# Patient Record
Sex: Female | Born: 1935 | Race: White | Hispanic: No | Smoking: Former smoker
Health system: Southern US, Community
[De-identification: ages and names within clinical notes are randomized; demographics above are authoritative.]

## PROBLEM LIST (undated history)

## (undated) DIAGNOSIS — K644 Residual hemorrhoidal skin tags: Secondary | ICD-10-CM

## (undated) DIAGNOSIS — K219 Gastro-esophageal reflux disease without esophagitis: Secondary | ICD-10-CM

## (undated) DIAGNOSIS — N3010 Interstitial cystitis (chronic) without hematuria: Secondary | ICD-10-CM

## (undated) DIAGNOSIS — Z9189 Other specified personal risk factors, not elsewhere classified: Secondary | ICD-10-CM

## (undated) DIAGNOSIS — I10 Essential (primary) hypertension: Secondary | ICD-10-CM

## (undated) DIAGNOSIS — I4510 Unspecified right bundle-branch block: Secondary | ICD-10-CM

## (undated) DIAGNOSIS — I639 Cerebral infarction, unspecified: Secondary | ICD-10-CM

## (undated) DIAGNOSIS — F411 Generalized anxiety disorder: Secondary | ICD-10-CM

## (undated) DIAGNOSIS — K625 Hemorrhage of anus and rectum: Secondary | ICD-10-CM

## (undated) DIAGNOSIS — E785 Hyperlipidemia, unspecified: Secondary | ICD-10-CM

## (undated) DIAGNOSIS — D509 Iron deficiency anemia, unspecified: Secondary | ICD-10-CM

## (undated) DIAGNOSIS — M810 Age-related osteoporosis without current pathological fracture: Secondary | ICD-10-CM

## (undated) DIAGNOSIS — R079 Chest pain, unspecified: Secondary | ICD-10-CM

## (undated) DIAGNOSIS — I6529 Occlusion and stenosis of unspecified carotid artery: Secondary | ICD-10-CM

## (undated) DIAGNOSIS — K117 Disturbances of salivary secretion: Secondary | ICD-10-CM

## (undated) HISTORY — DX: Disturbances of salivary secretion: K11.7

## (undated) HISTORY — DX: Hyperlipidemia, unspecified: E78.5

## (undated) HISTORY — DX: Cerebral infarction, unspecified: I63.9

## (undated) HISTORY — DX: Unspecified right bundle-branch block: I45.10

## (undated) HISTORY — DX: Hemorrhage of anus and rectum: K62.5

## (undated) HISTORY — DX: Gastro-esophageal reflux disease without esophagitis: K21.9

## (undated) HISTORY — DX: Residual hemorrhoidal skin tags: K64.4

## (undated) HISTORY — DX: Age-related osteoporosis without current pathological fracture: M81.0

## (undated) HISTORY — DX: Interstitial cystitis (chronic) without hematuria: N30.10

## (undated) HISTORY — DX: Other specified personal risk factors, not elsewhere classified: Z91.89

## (undated) HISTORY — DX: Essential (primary) hypertension: I10

## (undated) HISTORY — DX: Chest pain, unspecified: R07.9

## (undated) HISTORY — DX: Iron deficiency anemia, unspecified: D50.9

## (undated) HISTORY — DX: Occlusion and stenosis of unspecified carotid artery: I65.29

## (undated) HISTORY — DX: Generalized anxiety disorder: F41.1

## (undated) HISTORY — PX: ABDOMINAL HYSTERECTOMY: SHX81

---

## 1999-05-06 ENCOUNTER — Encounter: Admission: RE | Admit: 1999-05-06 | Discharge: 1999-05-06 | Payer: Self-pay | Admitting: Family Medicine

## 1999-05-06 ENCOUNTER — Encounter: Payer: Self-pay | Admitting: Family Medicine

## 1999-09-04 ENCOUNTER — Encounter: Payer: Self-pay | Admitting: Specialist

## 1999-09-10 ENCOUNTER — Inpatient Hospital Stay (HOSPITAL_COMMUNITY): Admission: RE | Admit: 1999-09-10 | Discharge: 1999-09-12 | Payer: Self-pay | Admitting: Specialist

## 2000-07-13 ENCOUNTER — Encounter: Admission: RE | Admit: 2000-07-13 | Discharge: 2000-07-13 | Payer: Self-pay | Admitting: Family Medicine

## 2000-07-13 ENCOUNTER — Encounter: Payer: Self-pay | Admitting: Family Medicine

## 2001-08-01 ENCOUNTER — Encounter: Admission: RE | Admit: 2001-08-01 | Discharge: 2001-08-01 | Payer: Self-pay | Admitting: Family Medicine

## 2001-08-01 ENCOUNTER — Encounter: Payer: Self-pay | Admitting: Family Medicine

## 2002-05-01 ENCOUNTER — Encounter: Payer: Self-pay | Admitting: Cardiovascular Disease

## 2002-05-01 ENCOUNTER — Ambulatory Visit (HOSPITAL_COMMUNITY): Admission: RE | Admit: 2002-05-01 | Discharge: 2002-05-01 | Payer: Self-pay | Admitting: Cardiovascular Disease

## 2003-01-10 ENCOUNTER — Ambulatory Visit (HOSPITAL_COMMUNITY): Admission: RE | Admit: 2003-01-10 | Discharge: 2003-01-10 | Payer: Self-pay | Admitting: Orthopedic Surgery

## 2003-02-21 ENCOUNTER — Encounter: Admission: RE | Admit: 2003-02-21 | Discharge: 2003-02-21 | Payer: Self-pay | Admitting: Family Medicine

## 2003-03-30 ENCOUNTER — Encounter: Admission: RE | Admit: 2003-03-30 | Discharge: 2003-03-30 | Payer: Self-pay | Admitting: Family Medicine

## 2003-07-06 ENCOUNTER — Emergency Department (HOSPITAL_COMMUNITY): Admission: EM | Admit: 2003-07-06 | Discharge: 2003-07-06 | Payer: Self-pay | Admitting: Emergency Medicine

## 2003-07-11 ENCOUNTER — Ambulatory Visit (HOSPITAL_COMMUNITY): Admission: RE | Admit: 2003-07-11 | Discharge: 2003-07-11 | Payer: Self-pay | Admitting: Gastroenterology

## 2003-07-11 ENCOUNTER — Encounter: Payer: Self-pay | Admitting: Gastroenterology

## 2004-01-01 ENCOUNTER — Ambulatory Visit: Payer: Self-pay | Admitting: Family Medicine

## 2004-03-07 ENCOUNTER — Ambulatory Visit: Payer: Self-pay | Admitting: Family Medicine

## 2004-03-12 ENCOUNTER — Ambulatory Visit: Payer: Self-pay | Admitting: Gastroenterology

## 2004-03-17 ENCOUNTER — Ambulatory Visit: Payer: Self-pay | Admitting: Family Medicine

## 2004-03-26 ENCOUNTER — Ambulatory Visit: Payer: Self-pay | Admitting: Gastroenterology

## 2004-04-10 ENCOUNTER — Ambulatory Visit: Payer: Self-pay | Admitting: Family Medicine

## 2004-04-15 ENCOUNTER — Ambulatory Visit: Payer: Self-pay | Admitting: Family Medicine

## 2004-07-15 ENCOUNTER — Ambulatory Visit: Payer: Self-pay | Admitting: Family Medicine

## 2004-09-16 ENCOUNTER — Ambulatory Visit: Payer: Self-pay | Admitting: Family Medicine

## 2004-10-06 ENCOUNTER — Ambulatory Visit: Payer: Self-pay | Admitting: Family Medicine

## 2004-10-06 ENCOUNTER — Encounter: Admission: RE | Admit: 2004-10-06 | Discharge: 2004-10-06 | Payer: Self-pay | Admitting: Family Medicine

## 2004-10-21 ENCOUNTER — Ambulatory Visit: Payer: Self-pay | Admitting: Family Medicine

## 2004-11-03 ENCOUNTER — Ambulatory Visit: Payer: Self-pay | Admitting: Family Medicine

## 2004-11-21 ENCOUNTER — Ambulatory Visit: Payer: Self-pay

## 2005-01-27 ENCOUNTER — Ambulatory Visit: Payer: Self-pay | Admitting: Family Medicine

## 2005-03-05 ENCOUNTER — Ambulatory Visit: Payer: Self-pay | Admitting: Family Medicine

## 2005-03-18 ENCOUNTER — Ambulatory Visit: Payer: Self-pay | Admitting: Family Medicine

## 2005-04-08 ENCOUNTER — Ambulatory Visit: Payer: Self-pay | Admitting: Family Medicine

## 2005-05-06 ENCOUNTER — Ambulatory Visit: Payer: Self-pay | Admitting: Family Medicine

## 2005-06-09 ENCOUNTER — Ambulatory Visit: Payer: Self-pay | Admitting: Family Medicine

## 2005-07-02 ENCOUNTER — Inpatient Hospital Stay (HOSPITAL_COMMUNITY): Admission: RE | Admit: 2005-07-02 | Discharge: 2005-07-06 | Payer: Self-pay | Admitting: Specialist

## 2005-08-18 ENCOUNTER — Ambulatory Visit: Payer: Self-pay | Admitting: Family Medicine

## 2005-11-04 ENCOUNTER — Ambulatory Visit: Payer: Self-pay | Admitting: Family Medicine

## 2005-12-15 ENCOUNTER — Emergency Department (HOSPITAL_COMMUNITY): Admission: EM | Admit: 2005-12-15 | Discharge: 2005-12-16 | Payer: Self-pay | Admitting: Emergency Medicine

## 2005-12-18 ENCOUNTER — Ambulatory Visit: Payer: Self-pay | Admitting: Family Medicine

## 2005-12-24 ENCOUNTER — Ambulatory Visit: Payer: Self-pay | Admitting: Family Medicine

## 2005-12-24 LAB — CONVERTED CEMR LAB
AST: 23 units/L (ref 0–37)
Albumin: 4 g/dL (ref 3.5–5.2)
BUN: 17 mg/dL (ref 6–23)
Basophils Absolute: 0 10*3/uL (ref 0.0–0.1)
Basophils Relative: 0.4 % (ref 0.0–1.0)
Calcium: 9.2 mg/dL (ref 8.4–10.5)
GFR calc non Af Amer: 50 mL/min
HCT: 35.7 % — ABNORMAL LOW (ref 36.0–46.0)
LDL DIRECT: 133.5 mg/dL
Lymphocytes Relative: 27.6 % (ref 12.0–46.0)
MCHC: 33.4 g/dL (ref 30.0–36.0)
MCV: 86.2 fL (ref 78.0–100.0)
Monocytes Absolute: 0.7 10*3/uL (ref 0.2–0.7)
Neutro Abs: 3.5 10*3/uL (ref 1.4–7.7)
Neutrophils Relative %: 56.8 % (ref 43.0–77.0)
TSH: 3.09 microintl units/mL (ref 0.35–5.50)
Total Bilirubin: 0.7 mg/dL (ref 0.3–1.2)
Total Protein: 7.7 g/dL (ref 6.0–8.3)
VLDL: 32 mg/dL (ref 0–40)

## 2005-12-29 ENCOUNTER — Emergency Department (HOSPITAL_COMMUNITY): Admission: EM | Admit: 2005-12-29 | Discharge: 2005-12-30 | Payer: Self-pay | Admitting: *Deleted

## 2005-12-30 ENCOUNTER — Ambulatory Visit: Payer: Self-pay | Admitting: Family Medicine

## 2006-01-04 ENCOUNTER — Emergency Department (HOSPITAL_COMMUNITY): Admission: EM | Admit: 2006-01-04 | Discharge: 2006-01-04 | Payer: Self-pay | Admitting: Emergency Medicine

## 2006-01-13 ENCOUNTER — Ambulatory Visit: Payer: Self-pay | Admitting: Family Medicine

## 2006-02-05 ENCOUNTER — Ambulatory Visit: Payer: Self-pay | Admitting: Family Medicine

## 2006-04-20 ENCOUNTER — Ambulatory Visit: Payer: Self-pay | Admitting: Family Medicine

## 2006-04-29 ENCOUNTER — Ambulatory Visit: Payer: Self-pay | Admitting: Internal Medicine

## 2006-05-17 ENCOUNTER — Encounter: Admission: RE | Admit: 2006-05-17 | Discharge: 2006-05-17 | Payer: Self-pay | Admitting: Family Medicine

## 2006-06-25 ENCOUNTER — Ambulatory Visit: Payer: Self-pay | Admitting: Family Medicine

## 2006-06-25 LAB — CONVERTED CEMR LAB
Chloride: 108 meq/L (ref 96–112)
Creatinine, Ser: 1.1 mg/dL (ref 0.4–1.2)
GFR calc Af Amer: 61 mL/min
Sodium: 144 meq/L (ref 135–145)

## 2006-06-29 ENCOUNTER — Encounter: Payer: Self-pay | Admitting: Family Medicine

## 2006-06-29 DIAGNOSIS — I10 Essential (primary) hypertension: Secondary | ICD-10-CM

## 2006-06-29 DIAGNOSIS — N3010 Interstitial cystitis (chronic) without hematuria: Secondary | ICD-10-CM

## 2006-06-29 DIAGNOSIS — Z9189 Other specified personal risk factors, not elsewhere classified: Secondary | ICD-10-CM

## 2006-06-29 DIAGNOSIS — E785 Hyperlipidemia, unspecified: Secondary | ICD-10-CM

## 2006-06-29 DIAGNOSIS — M810 Age-related osteoporosis without current pathological fracture: Secondary | ICD-10-CM | POA: Insufficient documentation

## 2006-06-29 DIAGNOSIS — K219 Gastro-esophageal reflux disease without esophagitis: Secondary | ICD-10-CM

## 2006-06-29 HISTORY — DX: Age-related osteoporosis without current pathological fracture: M81.0

## 2006-06-29 HISTORY — DX: Interstitial cystitis (chronic) without hematuria: N30.10

## 2006-06-29 HISTORY — DX: Gastro-esophageal reflux disease without esophagitis: K21.9

## 2006-06-29 HISTORY — DX: Essential (primary) hypertension: I10

## 2006-06-29 HISTORY — DX: Other specified personal risk factors, not elsewhere classified: Z91.89

## 2006-06-29 HISTORY — DX: Hyperlipidemia, unspecified: E78.5

## 2006-09-24 ENCOUNTER — Emergency Department (HOSPITAL_COMMUNITY): Admission: EM | Admit: 2006-09-24 | Discharge: 2006-09-24 | Payer: Self-pay | Admitting: Emergency Medicine

## 2006-10-04 ENCOUNTER — Ambulatory Visit: Payer: Self-pay | Admitting: Cardiology

## 2006-10-05 ENCOUNTER — Encounter: Payer: Self-pay | Admitting: Family Medicine

## 2006-10-11 ENCOUNTER — Telehealth: Payer: Self-pay | Admitting: Family Medicine

## 2006-10-14 ENCOUNTER — Ambulatory Visit: Payer: Self-pay

## 2006-10-20 ENCOUNTER — Ambulatory Visit: Payer: Self-pay | Admitting: Family Medicine

## 2006-10-20 DIAGNOSIS — F411 Generalized anxiety disorder: Secondary | ICD-10-CM | POA: Insufficient documentation

## 2006-10-20 HISTORY — DX: Generalized anxiety disorder: F41.1

## 2006-10-20 LAB — CONVERTED CEMR LAB
Bilirubin Urine: NEGATIVE
Blood in Urine, dipstick: NEGATIVE
Glucose, Urine, Semiquant: NEGATIVE

## 2006-11-03 ENCOUNTER — Ambulatory Visit: Payer: Self-pay | Admitting: Family Medicine

## 2006-11-03 DIAGNOSIS — IMO0002 Reserved for concepts with insufficient information to code with codable children: Secondary | ICD-10-CM

## 2006-11-15 ENCOUNTER — Ambulatory Visit: Payer: Self-pay | Admitting: Cardiology

## 2006-11-15 LAB — CONVERTED CEMR LAB
Albumin: 3.6 g/dL (ref 3.5–5.2)
Cholesterol: 213 mg/dL (ref 0–200)
Direct LDL: 137 mg/dL
HDL: 33.3 mg/dL — ABNORMAL LOW (ref >39.0)
Total Bilirubin: 0.7 mg/dL (ref 0.3–1.2)
Total CHOL/HDL Ratio: 6.4
Total Protein: 7.1 g/dL (ref 6.0–8.3)

## 2006-11-24 ENCOUNTER — Encounter: Payer: Self-pay | Admitting: Family Medicine

## 2007-01-24 ENCOUNTER — Ambulatory Visit: Payer: Self-pay | Admitting: Family Medicine

## 2007-01-24 DIAGNOSIS — K117 Disturbances of salivary secretion: Secondary | ICD-10-CM

## 2007-01-24 DIAGNOSIS — H6120 Impacted cerumen, unspecified ear: Secondary | ICD-10-CM

## 2007-01-24 HISTORY — DX: Disturbances of salivary secretion: K11.7

## 2007-02-02 ENCOUNTER — Ambulatory Visit: Payer: Self-pay | Admitting: Cardiology

## 2007-02-02 LAB — CONVERTED CEMR LAB
Albumin: 3.6 g/dL (ref 3.5–5.2)
Direct LDL: 138 mg/dL
HDL: 37.5 mg/dL — ABNORMAL LOW (ref >39.0)
Total Bilirubin: 0.7 mg/dL (ref 0.3–1.2)
Total Protein: 6.8 g/dL (ref 6.0–8.3)
Triglycerides: 201 mg/dL (ref 0–149)

## 2007-03-22 ENCOUNTER — Telehealth: Payer: Self-pay | Admitting: Family Medicine

## 2007-03-23 ENCOUNTER — Ambulatory Visit: Payer: Self-pay | Admitting: Family Medicine

## 2007-03-23 DIAGNOSIS — N3000 Acute cystitis without hematuria: Secondary | ICD-10-CM | POA: Insufficient documentation

## 2007-03-23 LAB — CONVERTED CEMR LAB
Glucose, Urine, Semiquant: NEGATIVE
Ketones, urine, test strip: NEGATIVE
Nitrite: NEGATIVE
Specific Gravity, Urine: 1.01
Urobilinogen, UA: 0.2

## 2007-04-06 ENCOUNTER — Ambulatory Visit: Payer: Self-pay | Admitting: Internal Medicine

## 2007-04-06 ENCOUNTER — Telehealth: Payer: Self-pay | Admitting: Family Medicine

## 2007-04-08 ENCOUNTER — Encounter: Payer: Self-pay | Admitting: Internal Medicine

## 2007-05-19 ENCOUNTER — Encounter: Admission: RE | Admit: 2007-05-19 | Discharge: 2007-05-19 | Payer: Self-pay | Admitting: Family Medicine

## 2007-07-01 ENCOUNTER — Telehealth: Payer: Self-pay | Admitting: Family Medicine

## 2007-07-08 ENCOUNTER — Ambulatory Visit: Payer: Self-pay | Admitting: Family Medicine

## 2007-07-08 ENCOUNTER — Telehealth: Payer: Self-pay | Admitting: Family Medicine

## 2007-07-08 DIAGNOSIS — D649 Anemia, unspecified: Secondary | ICD-10-CM

## 2007-07-08 DIAGNOSIS — J209 Acute bronchitis, unspecified: Secondary | ICD-10-CM | POA: Insufficient documentation

## 2007-07-11 LAB — CONVERTED CEMR LAB
Albumin: 3.6 g/dL (ref 3.5–5.2)
Basophils Relative: 0.1 % (ref 0.0–1.0)
Cholesterol: 190 mg/dL (ref 0–200)
Eosinophils Relative: 1 % (ref 0.0–5.0)
LDL Cholesterol: 140 mg/dL — ABNORMAL HIGH (ref 0–99)
Lymphocytes Relative: 15.2 % (ref 12.0–46.0)
Monocytes Absolute: 0.9 10*3/uL (ref 0.1–1.0)
Monocytes Relative: 7.9 % (ref 3.0–12.0)
Neutro Abs: 8.2 10*3/uL — ABNORMAL HIGH (ref 1.4–7.7)
Neutrophils Relative %: 75.8 % (ref 43.0–77.0)
Total Bilirubin: 0.5 mg/dL (ref 0.3–1.2)
Total CHOL/HDL Ratio: 7.7
Total Protein: 7.2 g/dL (ref 6.0–8.3)
Triglycerides: 125 mg/dL (ref 0–149)
VLDL: 25 mg/dL (ref 0–40)
WBC: 10.8 10*3/uL — ABNORMAL HIGH (ref 4.5–10.5)

## 2007-10-21 ENCOUNTER — Ambulatory Visit: Payer: Self-pay | Admitting: Cardiology

## 2007-10-21 LAB — CONVERTED CEMR LAB
ALT: 15 units/L (ref 0–35)
Albumin: 3.8 g/dL (ref 3.5–5.2)
Alkaline Phosphatase: 43 units/L (ref 39–117)
HDL: 34.1 mg/dL — ABNORMAL LOW (ref >39.0)
Total Protein: 7 g/dL (ref 6.0–8.3)
Triglycerides: 142 mg/dL (ref 0–149)

## 2007-10-27 ENCOUNTER — Ambulatory Visit: Payer: Self-pay | Admitting: Cardiology

## 2007-10-27 ENCOUNTER — Ambulatory Visit: Payer: Self-pay

## 2007-11-24 ENCOUNTER — Ambulatory Visit: Payer: Self-pay | Admitting: Family Medicine

## 2008-01-02 ENCOUNTER — Ambulatory Visit: Payer: Self-pay | Admitting: Family Medicine

## 2008-01-02 DIAGNOSIS — H9209 Otalgia, unspecified ear: Secondary | ICD-10-CM | POA: Insufficient documentation

## 2008-01-02 DIAGNOSIS — K7689 Other specified diseases of liver: Secondary | ICD-10-CM | POA: Insufficient documentation

## 2008-01-03 ENCOUNTER — Encounter: Payer: Self-pay | Admitting: Family Medicine

## 2008-01-03 ENCOUNTER — Ambulatory Visit: Payer: Self-pay | Admitting: Internal Medicine

## 2008-01-08 ENCOUNTER — Emergency Department (HOSPITAL_COMMUNITY): Admission: EM | Admit: 2008-01-08 | Discharge: 2008-01-08 | Payer: Self-pay | Admitting: Family Medicine

## 2008-01-09 ENCOUNTER — Encounter: Admission: RE | Admit: 2008-01-09 | Discharge: 2008-01-09 | Payer: Self-pay | Admitting: Family Medicine

## 2008-01-09 ENCOUNTER — Telehealth: Payer: Self-pay | Admitting: Family Medicine

## 2008-01-10 ENCOUNTER — Telehealth: Payer: Self-pay | Admitting: Family Medicine

## 2008-01-11 ENCOUNTER — Encounter: Payer: Self-pay | Admitting: Family Medicine

## 2008-01-13 ENCOUNTER — Telehealth (INDEPENDENT_AMBULATORY_CARE_PROVIDER_SITE_OTHER): Payer: Self-pay | Admitting: *Deleted

## 2008-01-17 ENCOUNTER — Ambulatory Visit: Payer: Self-pay | Admitting: Gastroenterology

## 2008-01-17 DIAGNOSIS — K644 Residual hemorrhoidal skin tags: Secondary | ICD-10-CM

## 2008-01-17 DIAGNOSIS — K625 Hemorrhage of anus and rectum: Secondary | ICD-10-CM

## 2008-01-17 HISTORY — DX: Hemorrhage of anus and rectum: K62.5

## 2008-01-17 HISTORY — DX: Residual hemorrhoidal skin tags: K64.4

## 2008-01-18 LAB — CONVERTED CEMR LAB
Basophils Absolute: 0 10*3/uL (ref 0.0–0.1)
Eosinophils Relative: 3.6 % (ref 0.0–5.0)
Ferritin: 21.3 ng/mL (ref 10.0–291.0)
Hemoglobin: 11 g/dL — ABNORMAL LOW (ref 12.0–15.0)
Iron: 299 ug/dL — ABNORMAL HIGH (ref 42–145)
MCV: 89.7 fL (ref 78.0–100.0)
Monocytes Absolute: 0.6 10*3/uL (ref 0.1–1.0)
Monocytes Relative: 11 % (ref 3.0–12.0)
Neutro Abs: 3.3 10*3/uL (ref 1.4–7.7)
Neutrophils Relative %: 57.2 % (ref 43.0–77.0)
Vitamin B-12: >1500 pg/mL — ABNORMAL HIGH (ref 211–911)
WBC: 5.7 10*3/uL (ref 4.5–10.5)

## 2008-01-20 ENCOUNTER — Telehealth: Payer: Self-pay | Admitting: Gastroenterology

## 2008-01-23 ENCOUNTER — Ambulatory Visit: Payer: Self-pay | Admitting: Gastroenterology

## 2008-01-30 ENCOUNTER — Telehealth: Payer: Self-pay | Admitting: Gastroenterology

## 2008-01-30 ENCOUNTER — Telehealth: Payer: Self-pay | Admitting: Family Medicine

## 2008-04-27 ENCOUNTER — Ambulatory Visit: Payer: Self-pay | Admitting: Family Medicine

## 2008-07-26 ENCOUNTER — Ambulatory Visit: Payer: Self-pay | Admitting: Family Medicine

## 2008-07-26 DIAGNOSIS — F411 Generalized anxiety disorder: Secondary | ICD-10-CM | POA: Insufficient documentation

## 2008-07-26 DIAGNOSIS — D509 Iron deficiency anemia, unspecified: Secondary | ICD-10-CM

## 2008-07-26 HISTORY — DX: Generalized anxiety disorder: F41.1

## 2008-07-26 HISTORY — DX: Iron deficiency anemia, unspecified: D50.9

## 2008-07-27 ENCOUNTER — Encounter: Payer: Self-pay | Admitting: Family Medicine

## 2008-08-08 ENCOUNTER — Encounter: Payer: Self-pay | Admitting: Family Medicine

## 2008-09-04 ENCOUNTER — Encounter: Payer: Self-pay | Admitting: Family Medicine

## 2008-09-04 ENCOUNTER — Ambulatory Visit: Payer: Self-pay | Admitting: Urology

## 2008-09-13 ENCOUNTER — Encounter: Admission: RE | Admit: 2008-09-13 | Discharge: 2008-09-13 | Payer: Self-pay | Admitting: Family Medicine

## 2008-09-13 LAB — HM MAMMOGRAPHY

## 2008-09-20 ENCOUNTER — Encounter: Payer: Self-pay | Admitting: Family Medicine

## 2008-10-02 ENCOUNTER — Telehealth (INDEPENDENT_AMBULATORY_CARE_PROVIDER_SITE_OTHER): Payer: Self-pay | Admitting: *Deleted

## 2008-10-26 ENCOUNTER — Telehealth: Payer: Self-pay | Admitting: Family Medicine

## 2008-11-06 ENCOUNTER — Encounter: Payer: Self-pay | Admitting: Cardiology

## 2008-11-06 DIAGNOSIS — R079 Chest pain, unspecified: Secondary | ICD-10-CM

## 2008-11-06 DIAGNOSIS — I6529 Occlusion and stenosis of unspecified carotid artery: Secondary | ICD-10-CM

## 2008-11-06 DIAGNOSIS — I4510 Unspecified right bundle-branch block: Secondary | ICD-10-CM

## 2008-11-06 HISTORY — DX: Chest pain, unspecified: R07.9

## 2008-11-06 HISTORY — DX: Unspecified right bundle-branch block: I45.10

## 2008-11-06 HISTORY — DX: Occlusion and stenosis of unspecified carotid artery: I65.29

## 2008-11-07 ENCOUNTER — Ambulatory Visit: Payer: Self-pay | Admitting: Cardiology

## 2008-11-07 ENCOUNTER — Ambulatory Visit: Payer: Self-pay

## 2008-11-19 ENCOUNTER — Emergency Department (HOSPITAL_COMMUNITY): Admission: EM | Admit: 2008-11-19 | Discharge: 2008-11-19 | Payer: Self-pay | Admitting: Emergency Medicine

## 2008-11-22 ENCOUNTER — Ambulatory Visit: Payer: Self-pay | Admitting: Family Medicine

## 2008-12-05 ENCOUNTER — Ambulatory Visit: Payer: Self-pay | Admitting: Family Medicine

## 2009-01-15 ENCOUNTER — Emergency Department (HOSPITAL_COMMUNITY): Admission: EM | Admit: 2009-01-15 | Discharge: 2009-01-15 | Payer: Self-pay | Admitting: Emergency Medicine

## 2009-01-22 ENCOUNTER — Ambulatory Visit: Payer: Self-pay | Admitting: Family Medicine

## 2009-01-23 ENCOUNTER — Encounter (INDEPENDENT_AMBULATORY_CARE_PROVIDER_SITE_OTHER): Payer: Self-pay | Admitting: *Deleted

## 2009-01-25 ENCOUNTER — Encounter: Payer: Self-pay | Admitting: Family Medicine

## 2009-01-25 ENCOUNTER — Inpatient Hospital Stay (HOSPITAL_COMMUNITY): Admission: EM | Admit: 2009-01-25 | Discharge: 2009-01-29 | Payer: Self-pay | Admitting: Emergency Medicine

## 2009-01-26 ENCOUNTER — Encounter: Payer: Self-pay | Admitting: Family Medicine

## 2009-01-29 ENCOUNTER — Encounter: Payer: Self-pay | Admitting: Family Medicine

## 2009-03-21 ENCOUNTER — Encounter (INDEPENDENT_AMBULATORY_CARE_PROVIDER_SITE_OTHER): Payer: Self-pay | Admitting: *Deleted

## 2009-04-11 ENCOUNTER — Telehealth: Payer: Self-pay | Admitting: Family Medicine

## 2009-04-16 ENCOUNTER — Ambulatory Visit: Payer: Self-pay | Admitting: Family Medicine

## 2009-04-16 DIAGNOSIS — S82109A Unspecified fracture of upper end of unspecified tibia, initial encounter for closed fracture: Secondary | ICD-10-CM

## 2009-04-23 ENCOUNTER — Telehealth: Payer: Self-pay | Admitting: Family Medicine

## 2009-05-01 ENCOUNTER — Telehealth: Payer: Self-pay | Admitting: Family Medicine

## 2009-08-20 ENCOUNTER — Encounter: Payer: Self-pay | Admitting: Cardiology

## 2009-08-20 ENCOUNTER — Telehealth: Payer: Self-pay | Admitting: Cardiology

## 2009-08-21 ENCOUNTER — Ambulatory Visit: Payer: Self-pay

## 2009-08-21 ENCOUNTER — Ambulatory Visit: Payer: Self-pay | Admitting: Cardiology

## 2010-03-15 ENCOUNTER — Encounter: Payer: Self-pay | Admitting: Family Medicine

## 2010-03-16 ENCOUNTER — Encounter: Payer: Self-pay | Admitting: Family Medicine

## 2010-03-23 LAB — CONVERTED CEMR LAB
ALT: 14 units/L (ref 0–35)
AST: 24 units/L (ref 0–37)
Albumin: 3.7 g/dL (ref 3.5–5.2)
Alkaline Phosphatase: 69 units/L (ref 39–117)
BUN: 19 mg/dL (ref 6–23)
Basophils Absolute: 0 10*3/uL (ref 0.0–0.1)
Bilirubin, Direct: 0 mg/dL (ref 0.0–0.3)
Blood in Urine, dipstick: NEGATIVE
CO2: 29 meq/L (ref 19–32)
Chloride: 104 meq/L (ref 96–112)
Cholesterol: 221 mg/dL — ABNORMAL HIGH (ref 0–200)
Eosinophils Relative: 4.5 % (ref 0.0–5.0)
GFR calc non Af Amer: 45.2 mL/min (ref >60)
Glucose, Bld: 121 mg/dL — ABNORMAL HIGH (ref 70–99)
HCT: 32.3 % — ABNORMAL LOW (ref 36.0–46.0)
Ketones, urine, test strip: NEGATIVE
Lymphocytes Relative: 20.6 % (ref 12.0–46.0)
MCHC: 34.6 g/dL (ref 30.0–36.0)
MCV: 91.9 fL (ref 78.0–100.0)
Monocytes Relative: 12.4 % — ABNORMAL HIGH (ref 3.0–12.0)
Neutrophils Relative %: 62.1 % (ref 43.0–77.0)
Nitrite: NEGATIVE
Platelets: 236 10*3/uL (ref 150.0–400.0)
Potassium: 4.3 meq/L (ref 3.5–5.1)
RDW: 11.5 % (ref 11.5–14.6)
Total Bilirubin: 0.9 mg/dL (ref 0.3–1.2)
Total Protein: 7.3 g/dL (ref 6.0–8.3)
WBC Urine, dipstick: NEGATIVE

## 2010-03-25 NOTE — Letter (Signed)
Summary: Appointment - Reminder 2  [employer], Main Office  [address]   [place], [place] [address]   Phone: [phone]  Fax: [phone]     March 21, 2009 MRN: [id number]   [facility] [address] [place], [place]  [address]   Dear Ms. Leys,  Our records indicate that it is time to schedule a follow-up appointment with Dr. Myrtis Ser. It is very important that we reach you to schedule this appointment. We look forward to participating in your health care needs. Please contact us at the number listed above at your earliest convenience to schedule your appointment.  If you are unable to make an appointment at this time, give [place] a call so we can update our records.   Sincerely,   [facility] Scheduling Team

## 2010-03-25 NOTE — Letter (Signed)
Summary: [facility]-Dr. Modena Slater [place]-Dr. Otelia Sergeant   Imported By: Maryln Gottron 04/18/2009 13:56:32  _____________________________________________________________________  External Attachment:    Type:   Image     Comment:   External Document

## 2010-03-25 NOTE — Assessment & Plan Note (Signed)
Summary: f1y/jss    Visit Type:  1 yr f/u Primary Provider:  Gershon Crane, MD  CC:  carotid artery disease.  History of Present Illness: The patient is seen for cardiology followup.  I saw her last November 07, 2008.  She's stable.  She does have carotid artery disease.  Followup Doppler was done today.  Her carotids have been stable over serial exams.  She has 40-59% right internal carotid artery stenosis and 60-79% left internal carotid artery stenosis at the low end of the range.   Current Medications (verified): 1)  Calcium Carbonate-Vitamin D 600-400 Mg-Unit  Tabs (Calcium Carbonate-Vitamin D) .Marland Kitchen.. 1 By Mouth Once Daily 2)  Neurontin 300 Mg Caps (Gabapentin) .... At Bedtime 3)  Vitamin E 400 Unit Caps (Vitamin E) .... Take 2 Capsule By Mouth Twice A Day 4)  Zocor 40 Mg Tabs (Simvastatin) .Marland Kitchen.. 1 By Mouth Once Daily 5)  Xanax 1 Mg  Tabs (Alprazolam) .... Take One Tablet By Mouth Once Daily. 6)  Omeprazole 20 Mg  Cpdr (Omeprazole) .... Two Times A Day 7)  Lisinopril 20 Mg Tabs (Lisinopril) .Marland Kitchen.. 1 Tablet By Mouth Two Times A Day 8)  Clonidine Hcl 0.1 Mg Tabs (Clonidine Hcl) .... Take 1 Tab Two Times A Day 9)  Flonase 50 Mcg/act Susp (Fluticasone Propionate) .... 2 Sprays in Each Nostril Once Daily 10)  Oxybutynin Chloride 5 Mg Tabs (Oxybutynin Chloride) .... Two Times A Day 11)  Sucralfate 1 Gm Tabs (Sucralfate) .... Three Times A Day As Needed 12)  Hydrocortisone Acetate 25 Mg Supp (Hydrocortisone Acetate) .... Use Three Times A Day As Needed For Hemorrhoids 13)  Protonix 40 Mg  Tbec (Pantoprazole Sodium) .... Once Daily  Allergies: 1)  Voltaren (Diclofenac Sodium) 2)  Naprosyn (Naproxen)  Past History:  Past Medical History: GERD...esophageal dilatation in the past Hyperlipidemia Hypertension Osteoporosis per DEXA 01-03-08 rectal bleeding Anemia-iron deficiency Anxiety interstitial cystitis, has seen Dr. Wanda Plump insomnia carotid artery disease.Marland Kitchen  doppler.Marland Kitchen.10/2008.Marland KitchenMarland Kitchen40-59% RICA, 60-79% LICA.Marland Kitchen neuropathy...legs RBBB LV... normal function Chest pain... in the past... noninvasive studies showed no ischemia  Review of Systems       Patient denies fever, chills, headache, sweats, rash, change in vision, change in hearing, chest pain, cough, nausea vomiting, urinary symptoms.  All of the systems are reviewed and are negative.  Vital Signs:  Patient profile:   77 year old female Height:      61 inches Weight:      129 pounds BMI:     24.46 Pulse rate:   88 / minute Pulse rhythm:   irregular BP sitting:   120 / 70  (left arm) Cuff size:   regular  Vitals Entered By: Danielle Rankin, CMA (August 21, 2009 3:14 PM)  Physical Exam  General:  patient is stable today. Eyes:  no xanthelasma. Neck:  there are carotid bruits. Lungs:  lungs are clear.  Respiratory effort is not labored. Heart:  cardiac exam reveals S1 and S2.  There is no clicks or significant murmurs. Abdomen:  abdomen is soft. Extremities:  no peripheral edema. Skin:  no skin rashes. Psych:  patient is oriented to person time and place.  Affect is normal.   Impression & Recommendations:  Problem # 1:  CHEST PAIN (ICD-786.50)  The following medications were removed from the medication list:    Adult Aspirin Low Strength 81 Mg Chew (Aspirin) .Marland Kitchen... Take 1 tablet by mouth once a  day Her updated medication list for this problem includes:  Lisinopril 20 Mg Tabs (Lisinopril) .Marland Kitchen... 1 tablet by mouth two times a day The patient has had no recurrent chest pain.  No further workup is needed.  EKG is done today reviewed by me.  She has right bundle branch block.this is an old finding.  Problem # 2:  CAROTID ARTERY DISEASE (ICD-433.10)  The following medications were removed from the medication list:    Adult Aspirin Low Strength 81 Mg Chew (Aspirin) .Marland Kitchen... Take 1 tablet by mouth once a  day The patient had followup carotid Dopplers today.  Her disease now appears to be  stable.  Followup to be in one year.  Problem # 3:  HYPERTENSION (ICD-401.9)  The following medications were removed from the medication list:    Adult Aspirin Low Strength 81 Mg Chew (Aspirin) .Marland Kitchen... Take 1 tablet by mouth once a  day Her updated medication list for this problem includes:    Lisinopril 20 Mg Tabs (Lisinopril) .Marland Kitchen... 1 tablet by mouth two times a day    Clonidine Hcl 0.1 Mg Tabs (Clonidine hcl) .Marland Kitchen... Take 1 tab two times a day Blood pressure is controlled today.  No change in therapy.  Patient Instructions: 1)  Your physician wants you to follow-up in:  1 year.  You will receive a reminder letter in the mail two months in advance. If you don't receive a letter, please call our office to schedule the follow-up appointment.

## 2010-03-25 NOTE — Progress Notes (Signed)
Summary: lisinopril message correction  Phone Note Call from Patient   Caller: son,michael simmerman,[phone] Summary of Call: Rx refill dosage different than before.  Please call me at [phone]. Her previous Rx of Lisinopril 20mg  said Take one twice daily.  The new Rx that comes from [facility] that was received mail order says Lisinopril 20mg  Take one daily.  Her BP 130/80s high 140/90 on the 2 daily.  No dizziness.  Please correct Rx and/or verify that it is one daily.   Initial call taken by: Rudy Jew, RN,  May 01, 2009 2:04 PM  Follow-up for Phone Call        this may have been changed after she got out of the hospital. Her BP seems to be fairly stable on one pill a day, so let's keep it at one per day Follow-up by: Nelwyn Salisbury MD,  May 01, 2009 3:07 PM  Additional Follow-up for Phone Call Additional follow up Details #1::        My message must be unclear.  She is taking 2 daily. Additional Follow-up by: Rudy Jew, RN,  May 01, 2009 3:37 PM    Additional Follow-up for Phone Call Additional follow up Details #2::    I understand. In that case have her continue taking it two times a day . Please correct her rx to read two times a day, #180 with 3 rf Follow-up by: Nelwyn Salisbury MD,  May 01, 2009 4:03 PM  Additional Follow-up for Phone Call Additional follow up Details #3:: Details for Additional Follow-up Action Taken: spoke with female.  rx sent Additional Follow-up by: Kern Reap CMA Duncan Dull),  May 01, 2009 4:37 PM  New/Updated Medications: LISINOPRIL 20 MG TABS (LISINOPRIL) 1 tablet by mouth two times a day Prescriptions: LISINOPRIL 20 MG TABS (LISINOPRIL) 1 tablet by mouth two times a day  #180 x 3   Entered by:   Kern Reap CMA (AAMA)   Authorized by:   Nelwyn Salisbury MD   Signed by:   Kern Reap CMA (AAMA) on 05/01/2009   Method used:   Electronically to        [facility]* (retail)       [address]       [place], [place]   [address]       Ph: [phone]       Fax: [phone]   RxID:   [id number]

## 2010-03-25 NOTE — Miscellaneous (Signed)
Summary: Orders Update  Clinical Lists Changes  Orders: Added new Test order of Carotid Duplex (Carotid Duplex) - Signed 

## 2010-03-25 NOTE — Progress Notes (Signed)
Summary: refill xanax  Phone Note From Pharmacy   Caller: [facility] Call For: Lacey Stone  Summary of Call: refill xanax 1 by mouth three times a day Initial call taken by: Alfred Levins, CMA,  April 11, 2009 11:04 AM  Follow-up for Phone Call        call in #90 with 5 rf Follow-up by: Nelwyn Salisbury MD,  April 11, 2009 11:52 AM  Additional Follow-up for Phone Call Additional follow up Details #1::        Phone call completed, Pharmacist called Additional Follow-up by: Alfred Levins, CMA,  April 11, 2009 12:16 PM    Prescriptions: XANAX 1 MG  TABS (ALPRAZOLAM) Take one tablet by mouth once daily.  #270 x 1   Entered by:   Alfred Levins, CMA   Authorized by:   Nelwyn Salisbury MD   Signed by:   Alfred Levins, CMA on 04/11/2009   Method used:   Telephoned to ...       [facility] (mail-order)       [address]       [place], [place]  [id number]       Ph: [phone]       Fax: [phone]   RxID:   [id number]

## 2010-03-25 NOTE — Progress Notes (Signed)
Summary: pt needs a call asap   Phone Note Call from Patient Call back at [phone]   Caller: Patient Reason for Call: Talk to Nurse, Talk to Doctor Summary of Call: pt son said they called about her appt for tommorrow not sure why Initial call taken by: Omer Jack,  August 20, 2009 5:03 PM  Follow-up for Phone Call        spoke w/pt she had been called to confirm appt Meredith Staggers, RN  August 20, 2009 6:10 PM

## 2010-03-25 NOTE — Miscellaneous (Signed)
  Clinical Lists Changes  Observations: Added new observation of PAST MED HX: GERD...esophageal dilatation in the past Hyperlipidemia Hypertension Osteoporosis per DEXA 01-03-08 rectal bleeding Anemia-iron deficiency Anxiety interstitial cystitis, has seen Dr. Wanda Plump insomnia carotid artery disease.Marland Kitchen doppler.Marland Kitchen.10/2008.Marland KitchenMarland Kitchen40-59% RICA, 60-79% LICA.Marland Kitchen neuropathy...legs RBBB LV... normal function Chest pain... in the past... noninvasive studies showed no ischemia  (08/20/2009 14:38) Added new observation of PRIMARY MD: Gershon Crane, MD (08/20/2009 14:38)       Past History:  Past Medical History: GERD...esophageal dilatation in the past Hyperlipidemia Hypertension Osteoporosis per DEXA 01-03-08 rectal bleeding Anemia-iron deficiency Anxiety interstitial cystitis, has seen Dr. Wanda Plump insomnia carotid artery disease.Marland Kitchen doppler.Marland Kitchen.10/2008.Marland KitchenMarland Kitchen40-59% RICA, 60-79% LICA.Marland Kitchen neuropathy...legs RBBB LV... normal function Chest pain... in the past... noninvasive studies showed no ischemia

## 2010-03-25 NOTE — Progress Notes (Signed)
Summary: new rx  Phone Note Call from Patient Call back at Home Phone [phone]   Caller: Patient Call For: Nelwyn Salisbury MD Summary of Call: pt needs new rx  protonix 40mg  #90 pills only fax to [facility]. Initial call taken by: Heron Sabins,  April 23, 2009 2:58 PM  Follow-up for Phone Call        Phone Call Completed, Rx Called In Follow-up by: Alfred Levins, CMA,  April 23, 2009 3:57 PM    New/Updated Medications: PROTONIX 40 MG  TBEC (PANTOPRAZOLE SODIUM) once daily Prescriptions: PROTONIX 40 MG  TBEC (PANTOPRAZOLE SODIUM) once daily  #90 x 3   Entered by:   Alfred Levins, CMA   Authorized by:   Nelwyn Salisbury MD   Signed by:   Alfred Levins, CMA on 04/23/2009   Method used:   Faxed to ...       [facility] (mail-order)       [address]       [place], [place]  [id number]       Ph: [phone]       Fax: [phone]   RxID:   [id number]

## 2010-03-25 NOTE — Assessment & Plan Note (Signed)
Summary: fu from [facility]   Vital Signs:  Patient profile:   77 year old female Height:      61 inches Weight:      137 pounds Temp:     98.4 degrees F oral Pulse rate:   88 / minute BP sitting:   122 / 72  (left arm) Cuff size:   regular  Vitals Entered By: Alfred Levins, CMA (April 16, 2009 1:14 PM) CC: hosp f/u   History of Present Illness: Here to follow up on a right lateral  tibial plateau fracture and a right MCL strain with a small avulsion fracture which occurred on 01-25-09. She had fallen at home when her minister fell into her while trying to fix a garage door. She stayed in the hospital until 01-28-10, when she was transferred to [facility] for rehab. After staying there for 8 weeks she went home, And now she is getting PT at home 3 days a week. She is seeing Dr. Otelia Sergeant, and he is pleased with her progress. She is wearing a Neoprene sleeve and using a walker. She is getting out some to church and the beauty shop. There is very little pain now.   Current Medications (verified): 1)  Adult Aspirin Low Strength 81 Mg Chew (Aspirin) .... Take 1 Tablet By Mouth Once A  Day 2)  Calcium Carbonate-Vitamin D 600-400 Mg-Unit  Tabs (Calcium Carbonate-Vitamin D) .Marland Kitchen.. 1 By Mouth Once Daily 3)  Neurontin 300 Mg Caps (Gabapentin) .... At Bedtime 4)  Vitamin E 400 Unit Caps (Vitamin E) .... Take 2 Capsule By Mouth Twice A Day 5)  Zocor 40 Mg Tabs (Simvastatin) .Marland Kitchen.. 1 By Mouth Once Daily 6)  Xanax 1 Mg  Tabs (Alprazolam) .... Take One Tablet By Mouth Once Daily. 7)  Omeprazole 20 Mg  Cpdr (Omeprazole) .... Two Times A Day 8)  Lisinopril 20 Mg Tabs (Lisinopril) .Marland Kitchen.. 1 Tablet By Mouth Daily 9)  Clonidine Hcl 0.1 Mg Tabs (Clonidine Hcl) .... Take 1 Tab Two Times A Day 10)  Flonase 50 Mcg/act Susp (Fluticasone Propionate) .... 2 Sprays in Each Nostril Once Daily 11)  Oxybutynin Chloride 5 Mg Tabs (Oxybutynin Chloride) .... Two Times A Day 12)  Sucralfate 1  Gm Tabs (Sucralfate) .... Three Times A Day As Needed  Allergies (verified): 1)  Voltaren (Diclofenac Sodium) 2)  Naprosyn (Naproxen)  Past History:  Past Medical History: Reviewed history from 11/06/2008 and no changes required. GERD...esophageal dilatation in the past Hyperlipidemia Hypertension Osteoporosis per DEXA 01-03-08 rectal bleeding Anemia-iron deficiency Anxiety interstitial cystitis, has seen Dr. Wanda Plump insomnia carotid artery disease neuropathy...legs RBBB LV... normal function Chest pain... in the past... noninvasive studies showed no ischemia  Past Surgical History: Reviewed history from 07/26/2008 and no changes required. Hysterectomy colonosocopy 01-23-08 per Dr. Jarold Motto showing diverticulosis and internal hemorrhoids  Review of Systems  The patient denies anorexia, fever, weight loss, weight gain, vision loss, decreased hearing, hoarseness, chest pain, syncope, dyspnea on exertion, peripheral edema, prolonged cough, headaches, hemoptysis, abdominal pain, melena, hematochezia, severe indigestion/heartburn, hematuria, incontinence, genital sores, muscle weakness, suspicious skin lesions, transient blindness, difficulty walking, depression, unusual weight change, abnormal bleeding, enlarged lymph nodes, angioedema, breast masses, and testicular masses.    Physical Exam  General:  using a walker, gets around fairly well Lungs:  Normal respiratory effort, chest expands symmetrically. Lungs are clear to auscultation, no crackles or wheezes. Heart:  Normal rate and regular rhythm. S1 and S2 normal without gallop, murmur, click,  rub or other extra sounds.   Impression & Recommendations:  Problem # 1:  FRACTURE, TIBIAL PLATEAU (ICD-823.00)  Problem # 2:  HYPERTENSION (ICD-401.9)  Her updated medication list for this problem includes:    Lisinopril 20 Mg Tabs (Lisinopril) .Marland Kitchen... 1 tablet by mouth daily    Clonidine Hcl 0.1 Mg Tabs (Clonidine hcl) .Marland Kitchen...  Take 1 tab two times a day  Complete Medication List: 1)  Adult Aspirin Low Strength 81 Mg Chew (Aspirin) .... Take 1 tablet by mouth once a  day 2)  Calcium Carbonate-vitamin D 600-400 Mg-unit Tabs (Calcium carbonate-vitamin d) .Marland Kitchen.. 1 by mouth once daily 3)  Neurontin 300 Mg Caps (Gabapentin) .... At bedtime 4)  Vitamin E 400 Unit Caps (Vitamin e) .... Take 2 capsule by mouth twice a day 5)  Zocor 40 Mg Tabs (Simvastatin) .Marland Kitchen.. 1 by mouth once daily 6)  Xanax 1 Mg Tabs (Alprazolam) .... Take one tablet by mouth once daily. 7)  Omeprazole 20 Mg Cpdr (Omeprazole) .... Two times a day 8)  Lisinopril 20 Mg Tabs (Lisinopril) .Marland Kitchen.. 1 tablet by mouth daily 9)  Clonidine Hcl 0.1 Mg Tabs (Clonidine hcl) .... Take 1 tab two times a day 10)  Flonase 50 Mcg/act Susp (Fluticasone propionate) .... 2 sprays in each nostril once daily 11)  Oxybutynin Chloride 5 Mg Tabs (Oxybutynin chloride) .... Two times a day 12)  Sucralfate 1 Gm Tabs (Sucralfate) .... Three times a day as needed 13)  Hydrocortisone Acetate 25 Mg Supp (Hydrocortisone acetate) .... Use three times a day as needed for hemorrhoids  Patient Instructions: 1)  She seems to be recovering  well. To see Dr. Otelia Sergeant next week.  2)  Please schedule a follow-up appointment in 3 months .  Prescriptions: HYDROCORTISONE ACETATE 25 MG SUPP (HYDROCORTISONE ACETATE) use three times a day as needed for hemorrhoids  #60 x 5   Entered and Authorized by:   Nelwyn Salisbury MD   Signed by:   Nelwyn Salisbury MD on 04/16/2009   Method used:   Electronically to        [facility]  [phone]* (retail)       [address]       [place], [place]  [address]       Ph: [phone] or [phone]       Fax: [phone]   RxID:   [id number]

## 2010-04-08 ENCOUNTER — Encounter: Payer: Self-pay | Admitting: Family Medicine

## 2010-04-08 ENCOUNTER — Ambulatory Visit (INDEPENDENT_AMBULATORY_CARE_PROVIDER_SITE_OTHER): Payer: Medicare PPO | Admitting: Family Medicine

## 2010-04-08 VITALS — BP 110/70 | HR 72 | Temp 97.8°F | Resp 14 | Ht 65.0 in | Wt 118.0 lb

## 2010-04-08 DIAGNOSIS — N390 Urinary tract infection, site not specified: Secondary | ICD-10-CM

## 2010-04-08 LAB — POCT URINALYSIS DIPSTICK
Glucose, UA: NEGATIVE
Spec Grav, UA: 1.025

## 2010-04-08 MED ORDER — CIPROFLOXACIN HCL 500 MG PO TABS: 500.0000 mg | ORAL_TABLET | Freq: Two times a day (BID) | ORAL | Status: AC

## 2010-04-08 NOTE — Progress Notes (Signed)
  Subjective:    Patient ID: ARDITH TEST, female    DOB: [birth year], 77 y.o.   MRN: [id number]  HPI Here for what she thinks is a UTI. For 3 days she has had burning on urination and some urgency. No nausea or fever. Drinking water.    Review of Systems  Constitutional: Negative.   Respiratory: Negative.   Cardiovascular: Negative.   Gastrointestinal: Negative for abdominal pain.  Genitourinary: Positive for dysuria and frequency. Negative for hematuria, flank pain and difficulty urinating.       Objective:   Physical Exam  Constitutional: She appears well-developed and well-nourished. No distress.  Abdominal: Soft. Bowel sounds are normal. She exhibits no distension and no mass. There is no tenderness. There is no rebound and no guarding.          Assessment & Plan:  Treat with Cipro and send for a culture.

## 2010-04-10 ENCOUNTER — Encounter (HOSPITAL_COMMUNITY): Payer: Self-pay | Admitting: Radiology

## 2010-04-10 ENCOUNTER — Telehealth: Payer: Self-pay | Admitting: *Deleted

## 2010-04-10 ENCOUNTER — Other Ambulatory Visit: Payer: Self-pay

## 2010-04-10 ENCOUNTER — Other Ambulatory Visit (HOSPITAL_COMMUNITY): Payer: Medicare PPO

## 2010-04-10 ENCOUNTER — Emergency Department (HOSPITAL_COMMUNITY): Payer: Medicare PPO

## 2010-04-10 ENCOUNTER — Inpatient Hospital Stay (HOSPITAL_COMMUNITY)
Admission: EM | Admit: 2010-04-10 | Discharge: 2010-04-17 | DRG: 682 | Disposition: A | Discharge status: Home Health | Payer: Medicare PPO | Attending: Internal Medicine | Admitting: Internal Medicine

## 2010-04-10 DIAGNOSIS — I6529 Occlusion and stenosis of unspecified carotid artery: Secondary | ICD-10-CM | POA: Diagnosis present

## 2010-04-10 DIAGNOSIS — R5381 Other malaise: Secondary | ICD-10-CM | POA: Diagnosis present

## 2010-04-10 DIAGNOSIS — Z79899 Other long term (current) drug therapy: Secondary | ICD-10-CM

## 2010-04-10 DIAGNOSIS — R279 Unspecified lack of coordination: Secondary | ICD-10-CM | POA: Diagnosis present

## 2010-04-10 DIAGNOSIS — N1330 Unspecified hydronephrosis: Secondary | ICD-10-CM | POA: Diagnosis present

## 2010-04-10 DIAGNOSIS — K649 Unspecified hemorrhoids: Secondary | ICD-10-CM | POA: Diagnosis not present

## 2010-04-10 DIAGNOSIS — I6340 Cerebral infarction due to embolism of unspecified cerebral artery: Secondary | ICD-10-CM | POA: Diagnosis present

## 2010-04-10 DIAGNOSIS — E785 Hyperlipidemia, unspecified: Secondary | ICD-10-CM | POA: Diagnosis present

## 2010-04-10 DIAGNOSIS — I959 Hypotension, unspecified: Secondary | ICD-10-CM | POA: Diagnosis present

## 2010-04-10 DIAGNOSIS — I10 Essential (primary) hypertension: Secondary | ICD-10-CM | POA: Diagnosis present

## 2010-04-10 DIAGNOSIS — G3184 Mild cognitive impairment, so stated: Secondary | ICD-10-CM | POA: Diagnosis present

## 2010-04-10 DIAGNOSIS — N179 Acute kidney failure, unspecified: Principal | ICD-10-CM | POA: Diagnosis present

## 2010-04-10 DIAGNOSIS — F411 Generalized anxiety disorder: Secondary | ICD-10-CM | POA: Diagnosis present

## 2010-04-10 DIAGNOSIS — R4789 Other speech disturbances: Secondary | ICD-10-CM | POA: Diagnosis present

## 2010-04-10 DIAGNOSIS — K219 Gastro-esophageal reflux disease without esophagitis: Secondary | ICD-10-CM | POA: Diagnosis present

## 2010-04-10 DIAGNOSIS — E860 Dehydration: Secondary | ICD-10-CM | POA: Diagnosis present

## 2010-04-10 DIAGNOSIS — N3010 Interstitial cystitis (chronic) without hematuria: Secondary | ICD-10-CM | POA: Diagnosis present

## 2010-04-10 DIAGNOSIS — R5383 Other fatigue: Secondary | ICD-10-CM | POA: Diagnosis present

## 2010-04-10 DIAGNOSIS — T46905A Adverse effect of unspecified agents primarily affecting the cardiovascular system, initial encounter: Secondary | ICD-10-CM | POA: Diagnosis present

## 2010-04-10 LAB — COMPREHENSIVE METABOLIC PANEL
ALT: <8 U/L (ref 0–35)
AST: 26 U/L (ref 0–37)
CO2: 19 mEq/L (ref 19–32)
Calcium: 8.5 mg/dL (ref 8.4–10.5)
GFR calc Af Amer: 19 mL/min — ABNORMAL LOW (ref >60)
GFR calc non Af Amer: 16 mL/min — ABNORMAL LOW (ref >60)
Potassium: 5.8 mEq/L — ABNORMAL HIGH (ref 3.5–5.1)
Sodium: 135 mEq/L (ref 135–145)

## 2010-04-10 LAB — DIFFERENTIAL
Basophils Absolute: 0 10*3/uL (ref 0.0–0.1)
Basophils Relative: 0 % (ref 0–1)
Monocytes Absolute: 0.9 10*3/uL (ref 0.1–1.0)
Neutro Abs: 4.3 10*3/uL (ref 1.7–7.7)
Neutrophils Relative %: 61 % (ref 43–77)

## 2010-04-10 LAB — URINALYSIS, ROUTINE W REFLEX MICROSCOPIC
Ketones, ur: NEGATIVE mg/dL
Nitrite: NEGATIVE
Protein, ur: NEGATIVE mg/dL
Urobilinogen, UA: 0.2 mg/dL (ref 0.0–1.0)
pH: 5 (ref 5.0–8.0)

## 2010-04-10 LAB — CBC
Hemoglobin: 11.3 g/dL — ABNORMAL LOW (ref 12.0–15.0)
MCHC: 35.4 g/dL (ref 30.0–36.0)
RBC: 3.6 MIL/uL — ABNORMAL LOW (ref 3.87–5.11)
WBC: 7 10*3/uL (ref 4.0–10.5)

## 2010-04-10 LAB — URINE MICROSCOPIC-ADD ON

## 2010-04-10 LAB — TROPONIN I: Troponin I: 0.01 ng/mL (ref 0.00–0.06)

## 2010-04-10 LAB — BASIC METABOLIC PANEL
BUN: 80 mg/dL — ABNORMAL HIGH (ref 6–23)
CO2: 22 mEq/L (ref 19–32)
Chloride: 108 mEq/L (ref 96–112)
Creatinine, Ser: 2.43 mg/dL — ABNORMAL HIGH (ref 0.4–1.2)

## 2010-04-10 LAB — CK TOTAL AND CKMB (NOT AT ARMC): Relative Index: INVALID (ref 0.0–2.5)

## 2010-04-10 MED ORDER — CLONIDINE HCL 0.1 MG PO TABS: 0.1000 mg | ORAL_TABLET | Freq: Two times a day (BID) | ORAL | Status: AC

## 2010-04-10 NOTE — Telephone Encounter (Signed)
Needs Urine culture results.  Brenton Grills is the son.

## 2010-04-11 ENCOUNTER — Inpatient Hospital Stay (HOSPITAL_COMMUNITY): Payer: Medicare PPO

## 2010-04-11 LAB — CARDIAC PANEL(CRET KIN+CKTOT+MB+TROPI)
Relative Index: INVALID (ref 0.0–2.5)
Troponin I: 0.01 ng/mL (ref 0.00–0.06)
Troponin I: <0.01 ng/mL (ref 0.00–0.06)

## 2010-04-11 LAB — VITAMIN B12: Vitamin B-12: 737 pg/mL (ref 211–911)

## 2010-04-11 LAB — URINE CULTURE: Culture: NO GROWTH

## 2010-04-11 LAB — TSH: TSH: 3.589 u[IU]/mL (ref 0.350–4.500)

## 2010-04-11 LAB — BASIC METABOLIC PANEL
Calcium: 8.9 mg/dL (ref 8.4–10.5)
Creatinine, Ser: 1.74 mg/dL — ABNORMAL HIGH (ref 0.4–1.2)
GFR calc Af Amer: 33 mL/min — ABNORMAL LOW (ref >60)
GFR calc non Af Amer: 28 mL/min — ABNORMAL LOW (ref >60)
Sodium: 143 mEq/L (ref 135–145)

## 2010-04-11 LAB — CBC
MCH: 30.7 pg (ref 26.0–34.0)
MCHC: 34.9 g/dL (ref 30.0–36.0)
Platelets: 185 10*3/uL (ref 150–400)
RBC: 3.68 MIL/uL — ABNORMAL LOW (ref 3.87–5.11)

## 2010-04-11 LAB — LIPID PANEL
Cholesterol: 177 mg/dL (ref 0–200)
HDL: 27 mg/dL — ABNORMAL LOW (ref >39)
LDL Cholesterol: 114 mg/dL — ABNORMAL HIGH (ref 0–99)
Total CHOL/HDL Ratio: 6.6 RATIO
VLDL: 36 mg/dL (ref 0–40)

## 2010-04-11 NOTE — Telephone Encounter (Signed)
Spoke with [name] to inform him that urine culture not back and he replied "thats ok , she was admitted yest to hospital for dehyration hypotension and UTI.  Dr Clent Ridges informed

## 2010-04-11 NOTE — Telephone Encounter (Signed)
These are not back yet.

## 2010-04-12 ENCOUNTER — Inpatient Hospital Stay (HOSPITAL_COMMUNITY): Payer: Medicare PPO

## 2010-04-12 LAB — BASIC METABOLIC PANEL
CO2: 24 mEq/L (ref 19–32)
Calcium: 8.4 mg/dL (ref 8.4–10.5)
Chloride: 112 mEq/L (ref 96–112)
GFR calc Af Amer: 47 mL/min — ABNORMAL LOW (ref >60)
Potassium: 4.8 mEq/L (ref 3.5–5.1)
Sodium: 142 mEq/L (ref 135–145)

## 2010-04-12 LAB — CBC
Hemoglobin: 10.6 g/dL — ABNORMAL LOW (ref 12.0–15.0)
MCHC: 34.4 g/dL (ref 30.0–36.0)
RBC: 3.47 MIL/uL — ABNORMAL LOW (ref 3.87–5.11)
WBC: 6.9 10*3/uL (ref 4.0–10.5)

## 2010-04-13 DIAGNOSIS — I6789 Other cerebrovascular disease: Secondary | ICD-10-CM

## 2010-04-13 LAB — BASIC METABOLIC PANEL
BUN: 24 mg/dL — ABNORMAL HIGH (ref 6–23)
Creatinine, Ser: 1.25 mg/dL — ABNORMAL HIGH (ref 0.4–1.2)
GFR calc Af Amer: 49 mL/min — ABNORMAL LOW (ref >60)
GFR calc non Af Amer: 40 mL/min — ABNORMAL LOW (ref >60)

## 2010-04-13 LAB — CBC
MCV: 87.2 fL (ref 78.0–100.0)
Platelets: 159 10*3/uL (ref 150–400)
RBC: 3.27 MIL/uL — ABNORMAL LOW (ref 3.87–5.11)
WBC: 5.5 10*3/uL (ref 4.0–10.5)

## 2010-04-13 LAB — HEMOGLOBIN A1C
Hgb A1c MFr Bld: 5.9 % — ABNORMAL HIGH (ref <5.7)
Mean Plasma Glucose: 123 mg/dL — ABNORMAL HIGH (ref <117)

## 2010-04-14 ENCOUNTER — Encounter: Payer: Self-pay | Admitting: Family Medicine

## 2010-04-14 DIAGNOSIS — I6529 Occlusion and stenosis of unspecified carotid artery: Secondary | ICD-10-CM

## 2010-04-14 LAB — BASIC METABOLIC PANEL
BUN: 20 mg/dL (ref 6–23)
Calcium: 8.6 mg/dL (ref 8.4–10.5)
Creatinine, Ser: 1.22 mg/dL — ABNORMAL HIGH (ref 0.4–1.2)
GFR calc Af Amer: 50 mL/min — ABNORMAL LOW (ref >60)
GFR calc non Af Amer: 42 mL/min — ABNORMAL LOW (ref >60)

## 2010-04-14 LAB — CBC
MCH: 30.2 pg (ref 26.0–34.0)
MCHC: 34.7 g/dL (ref 30.0–36.0)
MCV: 86.9 fL (ref 78.0–100.0)
Platelets: 178 10*3/uL (ref 150–400)
RDW: 12.4 % (ref 11.5–15.5)

## 2010-04-15 DIAGNOSIS — I6789 Other cerebrovascular disease: Secondary | ICD-10-CM

## 2010-04-17 LAB — CULTURE, BLOOD (ROUTINE X 2): Culture: NO GROWTH

## 2010-04-17 NOTE — H&P (Signed)
NAMEKRISHANA, Stone                ACCOUNT NO.:  [id number]  MEDICAL RECORD NO.:  [id number]           PATIENT TYPE:  E  LOCATION:  [facility]                         FACILITY:  [facility]  PHYSICIAN:  Trudi Ida. Denton Lank, M.D.  DATE OF BIRTH:  May 02, 1919  DATE OF ADMISSION:  04/10/2010 DATE OF DISCHARGE:                             HISTORY & PHYSICAL   PRIMARY CARE PHYSICIAN:  Jeannett Senior A. Clent Ridges, MD  CHIEF COMPLAINT:  Generalized weakness over the last 1 week, progressively worsening.  HISTORY OF PRESENT ILLNESS:  Lacey Stone is an 77 year old female with a history of hypertension, GERD, history of chronic interstitial cystitis, anxiety, and hyperlipidemia presented to the [facility] with a chief complaint of weakness.  History was provided by the patient and her son, Mr. Lacey Stone present in the room.  Per the son, the patient lives alone in her house and up until 3 months ago, she has been driving, up until 2 weeks ago she was completely independent with her ADLs and even cooking breakfast for herself.  For the last 1 week, the patient has been complaining of overall generalized weakness, which has been progressively worsening.  The patient's family arranged a sitter to help out.  Two days ago, the patient started complaining of burning urination and increased frequency of urination.  She denied any fever, chills, any nausea, vomiting, abdominal pain, any diarrhea, or any headaches or cough.  The patient's family also noticed that in the last 3 months, the patient has been becoming forgetful and forgetting her appointments and did not want to go out of the house.  In the last 2 weeks, she had become increasingly forgetful and this morning she was talking to her son at 11 a.m. when she was talking gibberish.  However at the time of my encounter, the patient is completely oriented, eating, and talking inappropriately.  She denied any focal weakness or any numbness or  tingling.  The patient was noticed to have a BP of 93/67 at the time of triage as well as UTI with creatinine of 2.7.  No recent B- MET is available, however in 2010, creatinine function was 1.1 in December.  PAST MEDICAL HISTORY: 1. Hypertension. 2. History of chronic interstitial cystitis. 3. Acid reflux. 4. Anxiety. 5. Hyperlipidemia.  SOCIAL HISTORY:  The patient denies any smoking, alcohol, or any drug use.  She currently lives at home by herself and prior to 2 weeks ago, she was independent with her ADLs.  ALLERGIES:  No known drug allergies.  MEDICATIONS:  Prior to admission, 1. Ciprofloxacin started 2 days ago by PCP. 2. Clonidine 0.1 mg p.o. b.i.d. 3. Lisinopril 20 mg p.o. b.i.d. 4. Protonix 40 mg p.o. daily. 5. Simvastatin 40 mg p.o. daily, although the patient has been     prescribed Xanax by her PCP, the patient does not take it as she     has been sleeping too much.  PHYSICAL EXAMINATION:  VITAL SIGNS:  Blood pressure at the time of triage was 93/67 improved to 132/54, pulse rate 70, respiratory rate 22, and temp 97.8. GENERAL:  The patient is  alert, awake, and oriented x3 not in acute distress. HEENT:  Dry mucosal membranes.  EOMI.  Pupils reactive to light and accommodation. NECK:  Supple.  No lymphadenopathy.  No JVD. CARDIOVASCULAR SYSTEM:  S1, S2 clear.  Regular rate and rhythm. CHEST:  Clear to auscultation bilaterally. ABDOMEN:  Soft, nontender, nondistended.  Normal bowel sounds. EXTREMITIES:  No cyanosis, clubbing, or edema noted. NEUROLOGIC:  Cranial nerves II through XII intact.  Motor strength intact to 5/5 in all extremities.  Normal speech.  No facial droop, oriented x3.  LABORATORY AND DIAGNOSTIC DATA:  B-MET; sodium 136, potassium 4.8, BUN 80, creatinine 2.4, calcium 8.3.  B-MET at the time of triage today had shown BUN of 88 with a creatinine of 2.7 with potassium of 5.8, which was hemolyzed specimen.  Albumin 3.0.  CBC; white count 7.0,  hemoglobin 11.3, hematocrit 31.9, and platelets 162.  UA positive for UTI with wbc's 7-10, bacteria rare, negative nitrite, and leukocytes small.  RADIOLOGICAL DATA:  Chest x-ray two-view, chronic bronchitic changes. CT head without contrast on April 10, 2010, no intracranial hemorrhage or CT evidence of large lacunar infarct, small vessel disease type changes, vascular calcification.  IMPRESSION AND PLAN:  [name] is an 77 year old female with history of hypertension, chronic interstitial cystitis, anxiety, gastroesophageal reflux disease, and hyperlipidemia who presents with progressively worsening generalized weakness over the last 2 weeks. 1. Generalized weakness, multifactorial with likely secondary to     urinary tract infection, acute renal insufficiency, and     dehydration.  The patient will be admitted and also we will rule     out cerebrovascular accident.  Place on aspirin until     cerebrovascular accident  is ruled out. 2. Urinary tract infection.  Urine culture and blood cultures will be     obtained.  The patient was recently placed 2 days ago on     ciprofloxacin by her primary care physician.  I will place the     patient on Rocephin and also await for the culture and sensitivity. 3. Acute renal insufficiency likely, multifactorial secondary to     urinary tract infection lisinopril and dehydration.  We will obtain     urine lytes and renal ultrasound to rule any obstruction.  The     patient will be maintained on Rocephin and IV fluids. 4. Hypotension.  Hold lisinopril and continue clonidine with     parameters. 5. Gastroesophageal reflux disease.  Continue PPI. 6. Anxiety, currently stable.  The patient's requested the patient not     to be given Xanax because she sleeps too much with the Xanax. 7. Prophylaxis, Lovenox for DVT prophylaxis.     Thad Ranger, MD   ______________________________ Trudi Ida. Denton Lank, M.D.    RR/MEDQ  D:  04/10/2010  T:   04/10/2010  Job:  [id number]  cc:   Jeannett Senior A. Clent Ridges, MD  Electronically Signed by Andres Labrum Arzella Rehmann  on 04/17/2010 12:50:16 PM

## 2010-04-17 NOTE — Discharge Summary (Signed)
  NAMEMARKEETA, SCALF                ACCOUNT NO.:  [id number]  MEDICAL RECORD NO.:  [id number]           PATIENT TYPE:  I  LOCATION:  3007                         FACILITY:  [facility]  PHYSICIAN:  Talmage Nap, MD  DATE OF BIRTH:  06/24/1919  DATE OF ADMISSION:  04/10/2010 DATE OF DISCHARGE:  04/17/2010                        DISCHARGE SUMMARY - REFERRING   Please for discharge diagnoses and medications including hospital course refer to discharge summary initially dictated by Dr. Erick Blinks, on April 15, 2010.  The patient was however seen by me on two occasion, first occasion was on April 16, 2010, second occasion was on April 17, 2010.  During these two occasion, the patient clinically had remained stable and her vitals were also stable.  She was reevaluated by me today.  Night was said to be uneventful.  Her vital signs; blood pressure is 122/64, temperature is 97.6, pulse 60, and respiratory 22 and medically stable.  Plan is for the patient to be discharged on activity as tolerated.  Low- sodium, low-cholesterol diet and to be followed up by her primary care physician in 1-2 weeks.  For discharge medications, refer to summary dictated by Dr. Erick Blinks, on April 15, 2010.     Talmage Nap, MD     CN/MEDQ  D:  04/17/2010  T:  04/17/2010  Job:  [id number]  Electronically Signed by Talmage Nap  on 04/17/2010 05:08:21 PM

## 2010-04-19 NOTE — Consult Note (Signed)
Lacey Stone, Lacey Stone                ACCOUNT NO.:  [id number]  MEDICAL RECORD NO.:  [id number]           PATIENT TYPE:  LOCATION:                                 FACILITY:  PHYSICIAN:  Thana Farr, MD    DATE OF BIRTH:  Jun 03, 1919  DATE OF CONSULTATION:  04/12/2010 DATE OF DISCHARGE:                                CONSULTATION   CONSULT CALLED BY:  Dr. Meredith Mody.  HISTORY:  Lacey Stone is an 77 year old female that was initially brought to the hospital due to slurred speech and generalized weakness.  The patient was noted with her initial workup to have acute renal failure.  Despite addressing this appropriately.  The patient continued to have some speech difficulties.  MRI of the brain was performed that showed multiple small area of acute infarction bilaterally.  Consult was called for further recommendations.  The patient's symptoms have improved significantly.  PAST MEDICAL HISTORY: 1. Interstitial cystitis. 2. Acid reflux. 3. Anxiety.4. Hyperlipidemia. 5. Hypertension.  SOCIAL HISTORY:  The patient is widowed.  She lives alone.  She has no history of alcohol, tobacco, or illicit drug abuse.  MEDICATIONS PRIOR TO ADMISSION:  Cipro, clonidine, lisinopril, Protonix, simvastatin.  PHYSICAL EXAMINATION:  VITAL SIGNS:  Blood pressure 148/74, heart rate 57, respiratory rate 20, temperature 98.0. MENTAL STATUS TESTING:  The patient is alert.  She does not know what month it is.  She is unable to tell me what holiday just passed and has to think some time before she is able to tell me that it is 2012.  She can tell me multiple details about her life that happened quite some time ago.  Speech is fluent.  She is able to follow commands without difficulty. On cranial nerve testing:  II, visual fields full.  III, IV, VII, extraocular movements intact.  V and VII, smile symmetric.  VIII grossly intact.  IX and X, positive gag.  XI, bilateral shoulder shrug.  XII, midline tongue  extension. On motor exam:  The patient has 5/5 strength in both upper and lower extremities.  There is normal tone and bulk. Sensory:  Pinprick and light touch are intact bilaterally.  Deep tendon reflexes are 1+ in the upper extremities and absent in the lower extremity.  Plantars are mute bilaterally. On cerebellar testing, finger-to-nose and heel-to-shin intact.  LABORATORY DATA:  White blood cell count 6.9, platelet count 172, hemoglobin and hematocrit 10.6 and 30.8 respectively.  Sodium 142; potassium 4.8; chloride 112; CO2 is 24; BUN and creatinine 42 and 1.30, down from a high of 80 and 2.43.  Calcium 8.4, magnesium 2.2.  TSH 3.589.  B12 is 737.  MRA of the brain was performed that shows stenosis that is focal in the left posterior cerebral artery.  ASSESSMENT:  Lacey Stone is an 77 year old female with bilateral acute infarcts.  They are likely embolic in origin.  Etiology is unclear.  The patient also has had a decline in her memory.  This may be secondary to this small vessel ischemic changes but cannot rule out dementia as well, particularly with the patient's age and her distribution of  illness.  PLAN: 1. Echo, if normal, TEE to be performed. 2. Aspirin for stroke prophylaxis. 3. ESR, RPR. 4. Asymptomatic PCA stenosis is to be followed by Vascular.          ______________________________ Thana Farr, MD     LR/MEDQ  D:  04/12/2010  T:  04/13/2010  Job:  [id number]  Electronically Signed by Thana Farr MD on 04/19/2010 05:30:13 PM

## 2010-04-23 ENCOUNTER — Encounter (INDEPENDENT_AMBULATORY_CARE_PROVIDER_SITE_OTHER): Payer: Medicare PPO

## 2010-04-23 DIAGNOSIS — I4891 Unspecified atrial fibrillation: Secondary | ICD-10-CM

## 2010-05-06 NOTE — Discharge Summary (Signed)
Lacey Stone, Lacey Stone                ACCOUNT NO.:  [id number]  MEDICAL RECORD NO.:  [id number]           PATIENT TYPE:  I  LOCATION:  3007                         FACILITY:  [facility]  PHYSICIAN:  Erick Blinks, MD     DATE OF BIRTH:  March 11, 1919  DATE OF ADMISSION:  04/10/2010 DATE OF DISCHARGE:                        DISCHARGE SUMMARY - REFERRING   PRIMARY CARE PHYSICIAN:  Dr. Tera Mater. Clent Ridges, MD  DISCHARGE DIAGNOSES: 1. 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 2. Acute renal failure, improving. 3. Left carotid artery stenosis for outpatient surveillance. 4. Mild left hydronephrosis, for outpatient surveillance. 5. Hyperlipidemia. 6. Dehydration, improved. 7. Urinary tract infection, on antibiotics. 8. Gastroesophageal reflux disease. 9. Anxiety. 10.Generalized weakness, improved. 11.Short-term memory impairment.  DISCHARGE MEDICATIONS: 1. Enteric-coated aspirin 325 mg p.o. daily. 2. Colace 100 mg p.o. b.i.d. 3. Protonix 40 mg p.o. daily. 4. Simvastatin 40 mg p.o. daily. 5. Clonidine 0.1 mg p.o. b.i.d. 6. Xanax 1 mg p.o. daily INR and her  ADMISSION HISTORY:  This is an 77 year old female with history of hypertension, GERD, and hyperlipidemia who presented to the Emergency Room with complaints of weakness.  The patient was brought by her son. Apparently, the patient had been living alone and up until 3 months ago, have been driving.  She had been complaining of overall generalized weakness for the past 1 week which has progressively became worse.  She was also becoming increasingly forgetful and her speech has become incoherent.  Apparently, she had some slurred speech on the day of admission as well.  On arrival to the Emergency Room, she was found to be hypotensive with a blood pressure of 93/67 and was in acute renal failure with a creatinine of 2.7, was referred for admission.  For further details, please refer to history and physical dictated by Dr. Isidoro Donning on February  16.  HOSPITAL COURSE: 1. Bi-cerebellar infarcts.  The patient underwent a stroke workup due     to her generalized weakness.  Her MRI showed multiple small areas     of acute infarct in the cerebral white matter bilaterally     suggestive of emboli.  The patient was seen in consultation by the     Neurology Service and underwent a stroke workup including MRI of     the head, which showed severe focal stenosis in the left posterior     cerebral artery as well as a 2-D echocardiogram, which did not show     any source of emboli.  She was subsequently went under     transesophageal echocardiogram, which again did not show any source     of emboli and showed preserved ejection fraction.  Carotid Dopplers     were done, which showed left carotid artery stenosis of 40%-59%.     The patient is continued on aspirin.  She does not have any     significant deficits at this time.  Her speech has improved and she     is ambulating with physical therapy.  She will need to see a     neurology in follow up in the next 1 month. 2. Acute renal  failure.  This has improved with IV hydration and holding her ACE inhibitor.  This will be restarted as an outpatient     once her creatinine has stabilized further. 3. Mild left hydronephrosis.  The patient was found to have a mild     left hydronephrosis on a renal ultrasound.  This was also confirmed     on a CAT scan without any source of obstruction or stone.  Case was     discussed with Dr. Vernie Ammons from Urology and it was recommended that     the patient have a repeat ultrasound in the next 3 months.  If her     hydronephrosis is persisting then she will need a further urologic     outpatient follow up. 4. Left carotid artery stenosis.  This can be followed by serial     carotid ultrasounds as an outpatient every 6 months. 5. Memory deficits.  The patient will be transferred to an assisted     living facility secondary to her memory deficits as she is  unable     to live by herself and the family could not provide a supervised     care.  CONSULTATIONS:  Neurology with recommendations as above.  PROCEDURES:  None.  DIAGNOSTIC IMAGING: 1. Chest x-ray from February 16 shows chronic bronchitic changes. 2. CT head after February 16 shows no intracranial hemorrhage or CT     evidence of large lacunar infarct. 3. Renal ultrasound on February 16 shows mild hydronephrosis on the     left. 4. CT abdomen and pelvis on February 18 shows mild left hydronephrosis     present without identified cause.  No calculus or mass present. 5. MRI of the brain on February 18 shows multiple small areas of acute     infarct in the cerebral white matter bilaterally suggestive of     cerebral emboli. 6. MRA of the brain on February 18 shows severe focal stenosis in the     left posterior cerebral artery can be due to an atherosclerotic     disease or otherwise no acute infarct in these territory of the MRI     yesterday. 7. A 2-D echocardiogram done on February 19 shows EF of 65%-70%.  No     regional wall motion abnormalities, grade 1 diastolic dysfunction     and no source of embolus. 8. Transesophageal echocardiogram shows normal LV function, no left     atrial appendage thrombus, and no left atrial thrombus.  DISCHARGE INSTRUCTIONS:  The patient should continue on a heart-healthy low-calorie diet, conduct her activity as tolerated.  She will need a follow up with her primary care physician in the next 1-2 weeks and follow up with Neurology in the next 1 month.     Erick Blinks, MD     JM/MEDQ  D:  04/15/2010  T:  04/15/2010  Job:  [id number]  cc:   Jeannett Senior A. Clent Ridges, MD  Electronically Signed by Erick Blinks  on 05/06/2010 05:37:28 PM

## 2010-05-27 LAB — BASIC METABOLIC PANEL
CO2: 27 mEq/L (ref 19–32)
Calcium: 8.6 mg/dL (ref 8.4–10.5)
Creatinine, Ser: 1.17 mg/dL (ref 0.4–1.2)
GFR calc Af Amer: 53 mL/min — ABNORMAL LOW (ref >60)
GFR calc non Af Amer: 44 mL/min — ABNORMAL LOW (ref >60)

## 2010-05-27 LAB — DIFFERENTIAL
Basophils Absolute: 0 10*3/uL (ref 0.0–0.1)
Basophils Relative: 0 % (ref 0–1)
Lymphocytes Relative: 17 % (ref 12–46)
Monocytes Relative: 7 % (ref 3–12)
Neutro Abs: 7.2 10*3/uL (ref 1.7–7.7)
Neutrophils Relative %: 75 % (ref 43–77)

## 2010-05-27 LAB — PROTIME-INR
INR: 1 (ref 0.00–1.49)
INR: 1.15 (ref 0.00–1.49)
Prothrombin Time: 13.9 seconds (ref 11.6–15.2)

## 2010-05-27 LAB — CBC
MCHC: 34.4 g/dL (ref 30.0–36.0)
RBC: 3.37 MIL/uL — ABNORMAL LOW (ref 3.87–5.11)
WBC: 9.7 10*3/uL (ref 4.0–10.5)

## 2010-06-29 ENCOUNTER — Inpatient Hospital Stay (HOSPITAL_COMMUNITY)
Admission: EM | Admit: 2010-06-29 | Discharge: 2010-07-01 | DRG: 394 | Disposition: A | Discharge status: Home / Self Care | Payer: Medicare PPO | Attending: Family Medicine | Admitting: Family Medicine

## 2010-06-29 DIAGNOSIS — D649 Anemia, unspecified: Secondary | ICD-10-CM | POA: Diagnosis present

## 2010-06-29 DIAGNOSIS — Z8673 Personal history of transient ischemic attack (TIA), and cerebral infarction without residual deficits: Secondary | ICD-10-CM

## 2010-06-29 DIAGNOSIS — F411 Generalized anxiety disorder: Secondary | ICD-10-CM | POA: Diagnosis present

## 2010-06-29 DIAGNOSIS — K219 Gastro-esophageal reflux disease without esophagitis: Secondary | ICD-10-CM | POA: Diagnosis present

## 2010-06-29 DIAGNOSIS — F0390 Unspecified dementia without behavioral disturbance: Secondary | ICD-10-CM | POA: Diagnosis present

## 2010-06-29 DIAGNOSIS — I10 Essential (primary) hypertension: Secondary | ICD-10-CM | POA: Diagnosis present

## 2010-06-29 DIAGNOSIS — Z7982 Long term (current) use of aspirin: Secondary | ICD-10-CM

## 2010-06-29 DIAGNOSIS — K649 Unspecified hemorrhoids: Principal | ICD-10-CM | POA: Diagnosis present

## 2010-06-29 LAB — URINALYSIS, ROUTINE W REFLEX MICROSCOPIC
Glucose, UA: NEGATIVE mg/dL
Hgb urine dipstick: NEGATIVE
Specific Gravity, Urine: 1.007 (ref 1.005–1.030)

## 2010-06-29 LAB — URINE MICROSCOPIC-ADD ON

## 2010-06-30 ENCOUNTER — Emergency Department (HOSPITAL_COMMUNITY): Payer: Medicare PPO

## 2010-06-30 LAB — CBC
HCT: 33.5 % — ABNORMAL LOW (ref 36.0–46.0)
HCT: 33.6 % — ABNORMAL LOW (ref 36.0–46.0)
HCT: 32.2 % — ABNORMAL LOW (ref 36.0–46.0)
Hemoglobin: 11.7 g/dL — ABNORMAL LOW (ref 12.0–15.0)
Hemoglobin: 11.2 g/dL — ABNORMAL LOW (ref 12.0–15.0)
MCH: 30.5 pg (ref 26.0–34.0)
MCHC: 34.8 g/dL (ref 30.0–36.0)
MCV: 87.7 fL (ref 78.0–100.0)
Platelets: 194 10*3/uL (ref 150–400)
Platelets: 191 K/uL (ref 150–400)
RBC: 3.67 MIL/uL — ABNORMAL LOW (ref 3.87–5.11)
RDW: 12.5 % (ref 11.5–15.5)
RDW: 12.4 % (ref 11.5–15.5)
RDW: 12.5 % (ref 11.5–15.5)
WBC: 8.2 10*3/uL (ref 4.0–10.5)
WBC: 8.5 10*3/uL (ref 4.0–10.5)
WBC: 7.9 K/uL (ref 4.0–10.5)

## 2010-06-30 LAB — BASIC METABOLIC PANEL
BUN: 21 mg/dL (ref 6–23)
Calcium: 9.4 mg/dL (ref 8.4–10.5)
GFR calc non Af Amer: 54 mL/min — ABNORMAL LOW (ref >60)
Potassium: 4.6 mEq/L (ref 3.5–5.1)
Sodium: 140 mEq/L (ref 135–145)

## 2010-06-30 LAB — POCT I-STAT, CHEM 8
BUN: 22 mg/dL (ref 6–23)
Calcium, Ion: 1.14 mmol/L (ref 1.12–1.32)
Chloride: 105 meq/L (ref 96–112)
Creatinine, Ser: 1.2 mg/dL (ref 0.4–1.2)
Glucose, Bld: 125 mg/dL — ABNORMAL HIGH (ref 70–99)
HCT: 35 % — ABNORMAL LOW (ref 36.0–46.0)
Hemoglobin: 11.9 g/dL — ABNORMAL LOW (ref 12.0–15.0)
Potassium: 4.1 meq/L (ref 3.5–5.1)
Sodium: 140 meq/L (ref 135–145)
TCO2: 30 mmol/L (ref 0–100)

## 2010-06-30 LAB — PROTIME-INR
INR: 0.94 (ref 0.00–1.49)
Prothrombin Time: 12.8 s (ref 11.6–15.2)

## 2010-06-30 LAB — DIFFERENTIAL
Basophils Absolute: 0 10*3/uL (ref 0.0–0.1)
Lymphocytes Relative: 29 % (ref 12–46)
Neutro Abs: 4.7 10*3/uL (ref 1.7–7.7)

## 2010-06-30 LAB — APTT: aPTT: 28 seconds (ref 24–37)

## 2010-06-30 MED ORDER — IOHEXOL 300 MG/ML  SOLN
100.0000 mL | Freq: Once | INTRAMUSCULAR | Status: AC | PRN
  Administered 2010-06-30: 100 mL via INTRAVENOUS

## 2010-07-01 LAB — COMPREHENSIVE METABOLIC PANEL
ALT: 8 U/L (ref 0–35)
AST: 16 U/L (ref 0–37)
CO2: 29 mEq/L (ref 19–32)
Calcium: 9.1 mg/dL (ref 8.4–10.5)
Creatinine, Ser: 1.04 mg/dL (ref 0.4–1.2)
GFR calc Af Amer: >60 mL/min (ref >60)
GFR calc non Af Amer: 50 mL/min — ABNORMAL LOW (ref >60)
Sodium: 140 mEq/L (ref 135–145)
Total Protein: 6.1 g/dL (ref 6.0–8.3)

## 2010-07-01 LAB — CBC
HCT: 31.7 % — ABNORMAL LOW (ref 36.0–46.0)
Hemoglobin: 11 g/dL — ABNORMAL LOW (ref 12.0–15.0)
MCH: 30.7 pg (ref 26.0–34.0)
MCHC: 34.9 g/dL (ref 30.0–36.0)
MCHC: 34.7 g/dL (ref 30.0–36.0)
MCV: 88.1 fL (ref 78.0–100.0)
RDW: 12.6 % (ref 11.5–15.5)
RDW: 12.5 % (ref 11.5–15.5)
WBC: 6.4 10*3/uL (ref 4.0–10.5)

## 2010-07-02 LAB — CROSSMATCH
ABO/RH(D): A POS
Antibody Screen: NEGATIVE
Unit division: 0
Unit division: 0

## 2010-07-06 NOTE — H&P (Signed)
NAMENORIE, LATENDRESSE                ACCOUNT NO.:  [id number]  MEDICAL RECORD NO.:  [id number]           PATIENT TYPE:  E  LOCATION:  [facility]                         FACILITY:  [facility]  PHYSICIAN:  Eduard Clos, MDDATE OF BIRTH:  [birth year]  DATE OF ADMISSION:  06/29/2010 DATE OF DISCHARGE:                             HISTORY & PHYSICAL   PRIMARY CARE PHYSICIAN:  Tera Mater. Clent Ridges, MD  CHIEF COMPLAINT:  Bleeding per rectum.  HISTORY OF PRESENTING ILLNESS:  An 77 year old female with a history of recent stroke, hypertension, hyperlipidemia, anxiety, had noticed some blood when she went to the bathroom last evening and when she came to the ER initially they thought it was vaginal bleed but the ER physician, Dr. Golda Acre, who rectal exam found frank blood per rectum.  The patient has been admitted for further workup.  The patient denies any nausea, vomiting, abdominal pain, dysuria, discharge.  Denies any dizziness or loss of consciousness.  Denies any fever or chills.  Denies any cough or phlegm.  Denies any chest pain or shortness of breath.  Denies any headache or focal deficit, or visual symptoms.  The patient has not had any rectal bleed before.  The patient does have history of hemorrhoids.  PAST MEDICAL HISTORY: 1. Recent stroke. 2. Hypertension. 3. Hyperlipidemia. 4. Anxiety. 5. Acid reflux.  PAST SURGICAL HISTORY:  Hysterectomy.  MEDICATIONS PER ADMISSION:  Has to be verified, includes aspirin, Protonix, simvastatin, clonidine, and docusate.  ALLERGIES:  No known drug allergies.  FAMILY HISTORY:  Nothing contributory.  SOCIAL HISTORY:  The patient denies smoking cigarettes, drinking alcohol, or using illegal drugs.  Lives in an assisted living.  Full code.  REVIEW OF SYSTEMS:  Other than in history of presenting illness, nothing else significant.  PHYSICAL EXAMINATION:  GENERAL:  The patient examined bedside not in acute distress. VITAL SIGNS:  Blood pressure is  around 200/110, pulse is 90 per minute, temperature 98.1, respirations 18 per minute.  O2 sat 99%. HEENT:  Anicteric.  No pallor noted.  No discharge from ears, eyes, nose, or mouth. CHEST:  Bilateral air entry present.  No rhonchi.  No crepitation. HEART:  S1, S2 heard. ABDOMEN:  Soft, nontender.  Bowel sounds heard. CNS:  He is alert, awake, oriented to time, place, and person.  Moves upper and lower extremities 5/5. EXTREMITIES:  Peripheral pulses present.  No edema.  LABORATORY DATA:  CT of the abdomen and pelvis with contrast shows no significant change since previous study, again demonstrated multiple hepatic cysts and mild nonspecific dilatation of the left renal collecting system.  No abnormal fluid collections or inflammatory changes demonstrated.  No obvious cause of rectal bleeding identified. CBC:  WBC is 8.2, hemoglobin is 11.7, hematocrit is 33.5, hemoglobin on April 14, 2010 was 10.8 and 31.1, platelets 187.  PT/INR is 12.8 and 0.94.  Basic metabolic panel, sodium 140, potassium 4.6, chloride 103, carbon dioxide 30, glucose 124, BUN 21, creatinine 0.9, calcium 9.4.  UA is negative for nitrite, leukocytes small, wbc's 3-6.  ASSESSMENT: 1. Rectal bleeding. 2. Accelerated hypertension. 3. History of recent stroke. 4. Gastroesophageal reflux disease.  5. Anxiety. 6. Early dementia.  PLAN: 1. At this time, we will admit the patient to telemetry. 2. For her GI bleed, at this time we are going to recheck CBC now and     we will do it q.8 h. for the next 24 hours.  If there is any     significant fall in the hemoglobin then we are going to transfuse.     At this time her rectal bleeding could be from a possible     diverticula or hemorrhoids.  The patient at this time is     hemodynamically stable.  May consult Gastroenterology for further     workup. 3. Uncontrolled hypertension.  We will, at this time, continue on     clonidine.  The patient did miss last night's  clonidine which we     have already given a dose now.  We will also place her on p.r.n.     hydralazine.  I am going to increase the dose of clonidine from 0.1     mg twice daily to 0.1 mg 3 times daily and closely follow her blood     pressure. 4. At this time, we are going to hold aspirin for possible GI bleed. 5. Further recommendation as condition evolves. 6. The patient is a full code.     Eduard Clos, MD     ANK/MEDQ  D:  06/30/2010  T:  06/30/2010  Job:  [id number]  cc:   Tera Mater. Clent Ridges, MD  Electronically Signed by Midge Minium MD on 07/06/2010 08:36:45 AM

## 2010-07-08 NOTE — Assessment & Plan Note (Signed)
[facility]                            CARDIOLOGY OFFICE NOTE   NAME:Stone, Lacey RUSK                       MRN:          [id number]  DATE:10/04/2006                            DOB:          [birth year]    Lacey Stone is seen for cardiology assessment.  I had taken care of her  husband Lacey Stone) in the past (he died in the past year).  I do  not have records of having seen her formally in the past.  She was  recently seen in the emergency room with some chest discomfort and it  was felt not to be cardiac in origin.  She does have GI symptoms and she  was allowed to go home.  She is here now for followup and she is not  having chest pain.  We know from 2001 that she had a Cardiolite scan at  that time that showed her normal ejection fraction and no ischemia.  She  does have some vascular disease with 40% - 59% stenosis of the left  internal carotid artery by Doppler in 2006 and it is now time for a 2-  year followup.  She has not had any syncope or presyncope.  She is not  having any major shortness of breath.  She does have diffuse aches and  pains from arthritis.   PAST MEDICAL HISTORY:  ALLERGIES:  VOLTAREN AND OTHER NSAIDS CAUSE  HIVES.   MEDICATIONS:  1. Aspirin 81.  2. Zestril 20.  3. Vitamins.  4. Actonel.  5. Xanax.  6. Crestor.  7. Clonidine.  8. Omeprazole 20.  9. Arthritis medicines.  10.Other herbal medications.  I always encourage our patient to take      as few of these as possible.   OTHER MEDICAL PROBLEMS:  See the list below.   REVIEW OF SYSTEMS:  Today she is feeling well.  She has neck pain and  back pain, otherwise her review of systems is negative.   PHYSICAL EXAMINATION:  Weight is 140.  Blood pressure is 120/71 with a  pulse of 72.  The patient is oriented to person, time and place.  Affect  is normal.  She is talkative.  She does talk about her husband passing  away in addition to multiple other issues.  HEENT:   Reveals no xanthelasma.  She has normal extraocular motion.  I  do not hear any carotid bruits.  There is no jugular venous distention.  LUNGS:  Clear.  Respiratory effort is not labored.  CARDIAC:  Reveals an S1 with an S2.  There are no clicks or significant  murmurs.  ABDOMEN:  Obese but soft.  There are no masses or bruits.  She has no peripheral edema.  There are 1+ distal pulses.   EKG reveals old right bundle branch block.   PROBLEMS:  1. ALLERGIES TO VOLTAREN AND OTHER NON-STEROIDALS.  2. Old right bundle branch block.  3. History of some type of neuropathy according to the patient but I      do not have the specifics.  4. Arthritis affecting the  neck and the back.  5. Issue of esophageal dilatation in the past.  6. Carotid disease with 40% - 59% left internal carotid stenosis and      she needs a Doppler 2-year followup at this time.  7. History of normal left ventricular function with a high normal      ejection fraction by nuclear scan in 2001.  8. Recent slight chest discomfort that took her to the emergency room      that does not appear to have been cardiac.  She does not need any      further cardiac without.  9. Hypertension, on medication.   Cardiac status appears to be stable, she needs carotid Dopplers and no  further cardiac workup.  I will see her back in 1 year for followup.     Luis Abed, MD, [facility]  Electronically Signed    JDK/MedQ  DD: 10/04/2006  DT: 10/04/2006  Job #: [id number]   cc:   Lacey Senior A. Clent Ridges, MD  Lacey Stone, M.D.

## 2010-07-08 NOTE — Assessment & Plan Note (Signed)
[facility]                         GASTROENTEROLOGY OFFICE NOTE   NAME:Lacey Stone, Lacey Stone                       MRN:          [id number]  DATE:04/06/2007                            DOB:          1920-02-22    PRIMARY CARE PHYSICIAN:  Gershon Crane, M.D.   PROBLEM:  Lower abdominal discomfort and mucusy stools.   HISTORY:  Lacey Stone is a very nice 77 year old white female, known to Dr.  Jarold Motto for prior colonoscopy done in February of 2006. She was noted  to have left sided diverticular disease and had an otherwise, negative  examination. She also has history of gastroesophageal reflux disease and  a hiatal hernia, for which she stays on Omeprazole. The patient says  that about a week and a half ago, she was seen by Dr. Clent Stone for lower  abdominal pain and was treated with a 10 day course of Ciprofloxacin for  what she says was a urinary tract infection. She finished her  Ciprofloxacin yesterday. Just prior to that time, she had had an  infected tooth and took a course of antibiotics for that as well. She  currently says that her abdominal pain has completely resolved but this  morning, she started passing globs of mucous. Apparently, she woke up  this morning with some mucous on her gown and then had 2 to 3 episodes  of urge for bowel movement followed by passage of a mucusy material.  There was no evidence of any melena or hematochezia and interestingly,  she say that she passed a little bit of stool with this mucous and then  later in the morning, had 2 formed bowel movements. She feels okay but  was worried because has not ever passed this much mucous in the past.   CURRENT MEDICATIONS:  1. Aspirin 81 mg daily.  2. Zestril 20 mg b.i.d.  3. Vitamin E and D daily.  4. Caltrate with magnesium daily.  5. Xanax 1 tablet t.i.d., uncertain of the milligram dosage.  6. Tri-Vita vitamin sublingual.  7. Clonidine twice daily, I believe 0.1.  8. She takes  several supplements including garlic, Ginkgo, vitamins B,      CoQ-10.  9. Omeprazole 20 mg daily.  10.Carafate p.r.n.   ALLERGIES:  VOLTAREN (hives), NSAIDS (hives.)   PAST MEDICAL HISTORY:  Pertinent for hypertension, hyperlipidemia,  osteoarthritis, chronic anxiety, previous hysterectomy and appendectomy.  Shoulder repair and knee repair.   FAMILY HISTORY:  Positive for heart disease in her father. Negative for  GI disease.   SOCIAL HISTORY:  The patient is widowed. She says she had been married 3  times and all of her husband's died. She talked quite a bit about her  late husband's death in 07/24/05 and obviously has some unresolved grief.  She currently lives alone with her dog. She is retired. Is close to her  children. She is a non-smoker, non-drinker.   REVIEW OF SYSTEMS:  Positive for arthritic symptoms. Occasional night  sweats, low back pain which she says is chronic and decreased auditory  acuity. Otherwise, review of systems as outlined in the HPI.  PHYSICAL EXAMINATION:  GENERAL:  A well developed, talkative, very nice  elderly white female.  VITAL SIGNS:  Height 5 foot 4. Weight 138. Blood pressure 110/60, pulse  72.  HEENT:  Normocephalic and atraumatic. EO MI. PERRLA. Sclerae anicteric.  CARDIOVASCULAR:  Regular rate and rhythm. S1 and S2. No murmur, rub, or  gallop.  PULMONARY:  Clear to auscultation and percussion.  ABDOMEN:  Soft. Bowel sounds active. Nontender. There is no mass or  hepatosplenomegaly. No guarding or rebound.  RECTAL:  Examination is Hemoccult negative. She does have mucusy  material on the examination glove and external hemorrhoids.   IMPRESSION:  1. An 77 year old white female with a 2 day history of mucous stool,      after 2 recent courses of antibiotics. I suspect that this is      antibiotic induced alteration of her colonic flora. She is not      actually having diarrhea at this time, nor is she having any      hematochezia, which was  an initial concern.  2. Diverticulosis.   PLAN:  1. Trial of Florastor 1 p.o. b.i.d. x10 days. She was given samples.  2. Check stool for C.Diff and stool for WBC's, given 2 recent course      of antibiotics.  3. Followup with Dr. Jarold Motto on a p.r.n. basis.      Mike Gip, PA-C  Electronically Signed      Hedwig Morton. Juanda Chance, MD  Electronically Signed   AE/MedQ  DD: 04/06/2007  DT: 04/07/2007  Job #: [id number]   cc:   Lacey Senior A. Clent Ridges, MD  Vania Rea. Jarold Motto, MD, Caleen Essex, FAGA

## 2010-07-08 NOTE — Assessment & Plan Note (Signed)
[facility]                            CARDIOLOGY OFFICE NOTE   NAME:Lacey Stone, Lacey Stone                       MRN:          [id number]  DATE:10/27/2007                            DOB:          [birth year]    Ms. Bowdoin is a delightful elderly lady who is actually doing well.  Her  husband has now been dead for 3 years.  She is dealing with this well.  She recently visited family in [place].  We know she has significant  carotid disease and this is being followed carefully.  She has an old  right bundle-branch block.  She is not having any chest pain, shortness  of breath, syncope, or presyncope.   PAST MEDICAL HISTORY:   ALLERGIES:  VOLTAREN, NSAIDS, and she tolerated LYRICA poorly.   MEDICATIONS:  Aspirin, Zestril, vitamin D, gabapentin, simvastatin,  clonidine 0.1 b.i.d., and other supplements.   OTHER MEDICAL PROBLEMS:  See the list below.   REVIEW OF SYSTEMS:  She has some numbness in her feet from her  neuropathy.  Otherwise, her review of systems is negative.   PHYSICAL EXAMINATION:  Weight is 134.  Blood pressure is 120/70 with a  pulse of 64.  The patient is oriented to person, time, and place.  Affect is normal.  HEENT reveals no xanthelasma.  She has normal  extraocular motion.  There is no jugular venous distention.  There are  no bruits heard today.  Her abdomen is soft.  She has no masses.  Cardiac exam reveals a S1 with a S2.  There is no click or significant  murmurs.  Her lungs are clear.  She has no significant peripheral edema.   EKG reveals right bundle-branch block that is old.   PROBLEMS:  1. Allergies to Voltaren, other nonsteroidals, and she feels poorly      with Lyrica.  2. Old right bundle-branch block.  3. History of neuropathy affecting her leg.  4. Arthritis of the neck and back.  5. Esophageal dilatation in the past.  6. Significant carotid disease.  Her Doppler was done in August 2008      revealed that there  had been a change.  She is to have another      carotid Doppler done today.  7. History of normal left ventricular function.  8. Some chest discomfort overtime with no further workup needed.  9. Hypertension treated.   She is stable.  No further cardiac workup is needed.     Luis Abed, MD, [facility]  Electronically Signed    JDK/MedQ  DD: 10/27/2007  DT: 10/27/2007  Job #: [id number]   cc:   Jeannett Senior A. Clent Ridges, MD

## 2010-07-11 NOTE — H&P (Signed)
NAMEFRANCELIA, [facility]                ACCOUNT NO.:  [id number]   MEDICAL RECORD NO.:  [id number]          PATIENT TYPE:  INP   LOCATION:  1604                         FACILITY:  [facility]   PHYSICIAN:  Jene Every, M.D.    DATE OF BIRTH:  Jul 02, 1919   DATE OF ADMISSION:  07/01/2005  DATE OF DISCHARGE:  07/06/2005                                HISTORY & PHYSICAL   CHIEF COMPLAINT:  Left shoulder pain.   HISTORY:  Mrs. Easler is a pleasant 77 year old female with a longstanding  history of left shoulder pain.  She has had previous rotator cuff repair on  the right.  She has not noted any specific injury, just notes gradual  worsening of her symptoms.  She initially underwent conservative treatment  including cortisone injection with short-term relief. MRI study was then  obtained which did show a full-thickness tear of the left rotator cuff.  It  is felt at this point she will need surgical repair and the risks and  benefits of this were discussed.  The patient obtained medical clearance.  She did elect to proceed.   MEDICAL HISTORY:  Significant for:  1.  Hypertension.  2.  Hypercholesterolemia.  3.  Gastroesophageal reflux disease.  4.  Osteoporosis.  5.  Anxiety.   CURRENT MEDICATIONS:  1.  Zestril 20 mg one p.o. q.a.m.  2.  Crestor 10 mg one p.o. q.a.m.  3.  Protonix 40 mg one p.o. q.a.m.  4.  Xanax 0.5 mg one p.o. q.h.s.  5.  Actonel 35 mg one p.o. every Wednesday.  6.  Aspirin 81 mg one p.o. daily.  7.  Garlic.  8.  Vitamin C.  9.  Vitamin E.  10. Multivitamin.  11. Vitamin B12.  12. Calcium with D.  13. Vicodin one to two p.o. q.4-6h. p.r.n. pain.  14. Tylenol Arthritis p.r.n. pain.   ALLERGIES:  Include:  1.  LYRICA.  2.  VOLTAREN.  3.  Most NONSTEROIDALS.   PREVIOUS SURGERY:  Includes:  1.  Hysterectomy.  2.  Breast biopsy.  3.  Knee arthroscopy.  4.  Right rotator cuff repair.  5.  Cyst removed on ankle.  6.  Carpal tunnel release.   SOCIAL HISTORY:  The  patient does admit to tobacco usage, negative for  alcohol.  Her husband recently passed.  Primary care physician is Dr. Clent Ridges.   REVIEW OF SYSTEMS:  GENERAL:  The patient denies any fever, chill, night  sweats, or bleeding tendencies.  CNS:  No blurred or double vision, seizure,  headache, or paralysis.  RESPIRATORY:  No shortness of breath, productive  cough, or hemoptysis.  CARDIOVASCULAR:  No chest pain, angina, or orthopnea.  GU:  No dysuria, hematuria, or discharge.  GI:  No nausea, vomiting,  diarrhea, constipation, melena, or bloody stools.  MUSCULOSKELETAL:  As  pertinent to HPI.   PHYSICAL EXAMINATION:  As taken from the health and history sheet:  VITAL SIGNS:  Temperature is 97, heart rate 75, respiratory rate 18, BP  124/70, O2 saturation 96%.  GENERAL:  This is a well-developed, well-nourished 77 year old female  seated  upright in no acute distress.  HEENT:  Atraumatic, normocephalic.  Pupils equal, round, and reactive to  light.  EOMs intact.  NECK:  Supple, no lymphadenopathy.  CHEST:  Clear to auscultation bilaterally.  No rhonchi, wheezes, or rales.  BREAST/GENITOURINARY:  Not examined, not pertinent to HPI.  ABDOMEN:  Soft, nontender, nondistended, positive bowel sounds x4.  EXTREMITIES:  The patient has a positive impingement on the left.  She does  have weakness of the external rotation.  Full painless range of motion of  the cervical spine is noted.  She does lack full internal as well as  external rotation.   IMPRESSION:  Retracted left rotator cuff tear.   PLAN:  The patient will be admitted to [facility] and undergo a  left rotator cuff repair.      Roma Schanz, P.A.      Jene Every, M.D.  Electronically Signed    CS/MEDQ  D:  07/16/2005  T:  07/16/2005  Job:  [id number]

## 2010-07-11 NOTE — Op Note (Signed)
Lacey [name], Lacey [name]                ACCOUNT NO.:  [id number]   MEDICAL RECORD NO.:  [id number]          PATIENT TYPE:  OIB   LOCATION:  1604                         FACILITY:  [facility]   PHYSICIAN:  Jene Every, M.D.    DATE OF BIRTH:  Oct 12, 1919   DATE OF PROCEDURE:  07/01/2005  DATE OF DISCHARGE:                                 OPERATIVE REPORT   PREOPERATIVE DIAGNOSIS:  Rotator cuff tear, impingement syndrome left  shoulder.   POSTOPERATIVE DIAGNOSIS:  Rotator cuff tear, impingement syndrome left  shoulder.   PROCEDURE PERFORMED:  1.  Open rotator cuff repair.  2.  Subacromial decompression.  3.  Acromioplasty.  4.  Utilization of two Mitek suture anchors.   ANESTHESIA:  General.   ASSISTANT:  Roma Schanz, P.A.   BRIEF HISTORY INDICATION:  An 77 year old, MRI indicating rotator cuff tear,  disabling pain prior to conservative treatment.  Operative intervention is  indicated for repair.  Risks and benefits discussed including bleeding,  infection, damage to vessels, suboptimal range of motion, recurrent tear,  need for revision, etc.   TECHNIQUE:  With the patient in the supine beach chair position, after the  induction of adequate general anesthesia, 1g of Kefzol, the left shoulder  and upper extremity was prepped and draped in the usual sterile fashion.  An  incision was made on the anterior aspect of the acromion in the Langer's  line.  Subcutaneous tissue was dissected __________ to achieve hemostasis.  The raphe between the anterior and lateral heads was identified and divided  about 3 cm.  Subperiosteal elevated from the anteromedial and anterolateral  aspect of the acromion.  CA ligament was detached and divided.  An  oscillating saw utilized to perform acromioplasty to a type 1 acromion.  Exuberant clear synovial fluid was obtained from the joint.  This was  evacuated and irrigated.  We lavaged the glenohumeral joint.  A significant  tear of the rotator cuff  was noted partially of the supraspinatus and  infraspinatus.  It was split along the lines of the head and degenerated and  torn.  Posteriorly, we reapproximated a portion of the tear with #0 Vicryl  of interrupted figure-of-eight sutures, we performed a trial __________ to  the lateral aspect of the humerus medial to the greater tuberosity.  After  repairing side-to-side, after freshening the rotator cuff ends, we repaired  with #1 Vicryl interrupted figure-of-eight sutures, put two Mitek suture  anchors in laterally and anterolaterally.  Advanced the tendon after this,  it was digitally mobilized and adhesions were lysed and secured it to this  trough, into the trough with two Mitek suture anchors with a good suture.  I  then oversewed with #1 Vicryl interrupted figure-of-eight suture and  oversewed the subscap, which had a slight tear in it as well.  Full coverage  of the head was obtained.  Good range without significant attention on the  wound was noted again, we had oversewn it laterally with #1 Vicryl and  interrupted figure-of-eight sutures.  The wound was then copiously  irrigated.  I repaired the raphe  with #1 Vicryl and interrupted figure-of-  eight sutures, it folded back over to the top of the acromion, and repaired  it side-to-side.  We left the attachment of the deltoid in place.  Repaired  the deltoid in trapezial fashion, the subcutaneous tissue with #2-0 Vicryl  simple sutures.  Skin was reapproximated with #4-0 subcuticular Prolene,  wound reinforced with Steri-Strips, sterile dressing applied.  Placed on an  abduction pillow, extubated without difficulty and transported to the  recovery room in satisfactory condition.   The patient tolerated the procedure well, there were no complications.  Minimal blood loss.      Jene Every, M.D.  Electronically Signed     JB/MEDQ  D:  07/01/2005  T:  07/02/2005  Job:  [id number]

## 2010-07-11 NOTE — Discharge Summary (Signed)
NAMECAMERON, KATAYAMA                ACCOUNT NO.:  [id number]   MEDICAL RECORD NO.:  [id number]          PATIENT TYPE:   LOCATION:                                 FACILITY:   PHYSICIAN:  Jene Every, M.D.         DATE OF BIRTH:   DATE OF ADMISSION:  07/02/2005  DATE OF DISCHARGE:  07/06/2005                                 DISCHARGE SUMMARY   ADMITTING DIAGNOSES:  1.  _massive____ left rotator cuff tear.  2.  Hypertension.  3.  Hypercholesterolemia.  4.  Gastroesophageal reflux disease.  5.  Osteoporosis.  6.  Anxiety.   DISCHARGE MEDICATIONS:  1.  _massive____ left rotator cuff tear.  2.  Hypertension.  3.  Hypercholesterolemia.  4.  Gastroesophageal reflux disease.  5.  Osteoporosis.  6.  Anxiety.  7.  Status post left rotator cuff repair.   PROCEDURES:  Patient was taken to the OR on Jul 01, 2005 to undergo left  rotator cuff repair.  Surgeon Dr. Jene Every.  Assistant Roma Schanz, P.A.-C.  Anesthesia general.  Complications none.   CONSULTS:  PT, OT, case management, Child psychotherapist.   HISTORY:  _____ is a pleasant 77 year old female with recent onset of left  shoulder pain.  The patient initially underwent corticosteroid injection  with short-term relief of symptoms.  MRI study was then obtained which did  show a large tear of the tendon.  It is felt at this point the patient would  benefit from a rotator cuff repair.  Surgical clearance was obtained.  Risks  and benefits _____ were discussed.  Patient does wish to proceed with the  surgery.   LABORATORIES:  Only preoperative laboratories obtained were PT/INR, PT of  13.1, INR of 1, PTT of 30.  No postoperative laboratories were ordered.  Preoperative chest x-ray _____ no acute cardiopulmonary process was noted.  No EKGs in the chart.  Patient did have a recent echocardiogram done by her  primary care physician obtained for surgical clearance.   HOSPITAL COURSE:  The patient was admitted, taken to the OR,  underwent the  above-stated procedure without difficulty.  She was then transferred to the  PACU and then to the orthopedic floor for continued postoperative care.  Postoperatively patient did fairly well.  There was concerning issues with  her being discharged to home as she does live alone.  She has a history of  neuropathy, therefore, she has difficulty with ambulation anyway __she had  limited mobility.  Incision remained clean and dry throughout the hospital  course.  Motor/neurovascular function remained intact.  Patient did progress  very slowly with therapy.  Case management as well as social work was  consulted to work on placement for the patient.  Placement was initially  arranged for [facility].  This was cleared with the family.  Postoperative  day #5 it was felt at this point patient felt to be stable to be discharged  to nursing facility of choice.  She will need less than 30 days until she is  able to come out of the sling  and move her arm more freely to help with her  gait issues.   DISPOSITION:  Patient discharged to [facility].  ____she will need follow up  with Dr. Shelle Iron approximately 14 days status post surgery for suture removal.  At that time we will progress her activities to more passive range of  motion.  While at the nursing facility she can come off the sling to range  of __left____ wrist, elbow, pendulum exercises, passive range of motion  only, no active range of motion.  She will need to have the arm supported at  all times whether in a sling or on a pillow, daily dressing changes until  sutures are removed.  Fall precautions should be taken with the patient's  history of neuropathy and gait disturbances.  It is okay for her to shower  and diet is as tolerated.   DISCHARGE MEDICATIONS:  1.  Zestril 20 mg one p.o. q.a.m.  2.  Crestor 10 mg one p.o. q.a.m.  3.  Protonix 40 mg one p.o. q.a.m.  4.  Xanax 0.5 mg one p.o. q.h.s.  5.  Actonel 35 mg one p.o.  every Wednesday 30 minutes a.c.  6.  Aspirin 81 mg one p.o. q.a.m.  7.  Garlic 500 mg one p.o. q.a.m.  8.  Vitamin C 1000 mg one p.o. b.i.d.  9.  Vitamin E 40 mg p.o. q.a.m.  10. Multivitamin daily.  11. Vitamin B12 one p.o. q.a.m.  12. Calcium with D one p.o. q.a.m.  13. D complex one p.o. q.a.m.  14. Vicodin one to two p.o. q.4-6h. p.r.n. pain for severe pain only.  15. Use Tylenol Arthritis 650 mg two p.o. q.8h. p.r.n. pain.   CONDITION ON DISCHARGE:  _____ doing well status post left rotator cuff  repair.      Roma Schanz, P.A.      Jene Every, M.D.  Electronically Signed    CS/MEDQ  D:  07/06/2005  T:  07/06/2005  Job:  [id number]

## 2010-07-11 NOTE — Op Note (Signed)
[facility]  Patient:    Lacey [name], Lacey [name]                       MRN: [id number] Proc. Date: 09/10/99 Adm. Date:  [id number] Attending:  Pierce Crane                           Operative Report  PREOPERATIVE DIAGNOSIS:  Rotator cuff tear right shoulder, glenohumeral arthrosis.  POSTOPERATIVE DIAGNOSIS:  Rotator cuff tear right shoulder, glenohumeral arthrosis.  Massive tear of the rotator cuff.  OPERATION PERFORMED:  Open rotator cuff repair, acromioplasty, bursectomy, irrigation of glenohumeral joint.  SURGEON:  Javier Docker, M.D.  ASSISTANT:  ANESTHESIA:  INDICATIONS FOR PROCEDURE:  The patient is a 77 year old with a documented rotator cuff tear by MRI.  Operative intervention was indicated for repair of the rotator cuff.  Risks and benefits were discussed including bleeding, infection, damage to neurovascular structures, recurrent tear, arthrofibrosis, etc.  DESCRIPTION OF PROCEDURE:  With the patient supine in beach chair position after induction of adequate general anesthesia, 1 gm Kefzol IV ____________ prophylaxis, the right shoulder and upper extremity was prepped and draped in the usual sterile fashion.  The acromial landmarks were demarcated with a surgical marker and incision was made at Langers lines over the anterior aspect of the acromion.  Subcutaneous tissues were dissected.  Electrocautery was utilized to achieve hemostasis.  The raphe between the anterior and lateral heads of the deltoid was identified, divided, subperiosteally elevated from the acromion laterally and medial to the [facility] joint.  Acromioplasty was performed with a Matt Holmes rongeur.  There is a slight anterior spur.  Hypertrophic bursa was noted and this was incised.  Examination of the rotator cuff revealed a massive complex tear, rotator cuff retracted, ____________ supraspinatus and the subscapular attachment.  The biceps tendon was found to be slight  fraying.  The rotator cuff then was abraded to good bleeding edges. More medially required ____________ of the tendon longitudinally with #1 Vicryl interrupted figure-of-eight sutures.  Next, a bony trough was developed just medial to the greater tuberosity.  Good bleeding bone.  Two Mitek sutures were then implanted with excellent purchase.  The rotator cuff was digitally mobilized laterally into the trough and secured with the Mitek sutures with excellent purchase obtained by the suture.  Next, this was oversewn with #1 Vicryl interrupted figure-of-eight sutures to the residual rotator cuff ____________ .  A small area within the center portion of the repair significant multidirectional tears and fraying.  This, however, was covered. Re-examination revealed no evidence of residual tears. There was watertight closure obtained.  I did however, felt would be appropriate to place in an abductor pillow to relieve tension on the repair.  The wound was irrigated once again.  No residual impingement was noted in the subacromial space.  The raphe was then repaired to the acromion with #1 Vicryl interrupted figure-of-eight sutures.  The subcutaneous tissues reapproximated with 2-0 Vicryl simple sutures.  Skin was reapproximated with 4-0 ____________ subcuticular.  The wound was reinforced with Steri-Strips.  0.25% Marcaine was infiltrated in the subacromial joint.  The wound was dressed sterilely. Patient placed in the abduction pillow, extubated without difficulty and transported to the recovery room in satisfactory condition.  The patient tolerated the procedure well with no complication. DD:  09/10/99 TD:  09/11/99 Job: [id number]

## 2010-07-11 NOTE — Assessment & Plan Note (Signed)
[facility]                                   ON-CALL NOTE   NAME:[name], Lacey                         MRN:          [id number]  DATE:12/15/2005                            DOB:          05-30-1919    PHONE CONVERSATION:  A patient of Dr. Clent Ridges.  She called from [phone], noting  that her blood pressure was very high.  The patient called at 9:29 p.m. on  December 15, 2005.  Said her nurse had come over and checked her blood  pressure this evening.  It was 180/101 and then came back a few hours later  it was 212/108.  The patient denies any headache but she has felt awful all  day long.  Because she felt so bad this morning, did not take her blood  pressure medicine until 5 this evening.  The patient states her daughter is  on her way to the house with her blood pressure pills and to come check her  blood pressure.  I told her if her blood pressure remained that high, that  she needed to go to the emergency room right away to be evaluated.       Lelon Perla, DO      YRL/MedQ  DD:  12/15/2005  DT:  12/16/2005  Job #:  [id number]   cc:   Tera Mater. Clent Ridges, MD

## 2010-07-11 NOTE — Discharge Summary (Signed)
[facility]  Patient:    Lacey [name], Lacey [name]                       MRN: [id number] Adm. Date:  [id number] Disc. Date: [id number] Attending:  Pierce Crane Dictator:   Della Goo, P.A.                           Discharge Summary  ADMISSION DIAGNOSES:  1. Rotator cuff tear on the right with associated impingement syndrome.  2. Glenohumeral degenerative changes.  3. Hypertension.  4. Hiatal hernia.  5. Gastroesophageal reflux disease.  6. Hypercholesterolemia.  7. Osteoporosis.  8. Chronic interstitial cystitis.  9. Recent pneumonia, treated by the patients medical doctor. 10. History of shingles.  DISCHARGE DIAGNOSES:  1. Rotator cuff tear on the right with associated impingement syndrome.  2. Glenohumeral degenerative changes.  3. Hypertension.  4. Hiatal hernia.  5. Gastroesophageal reflux disease.  6. Hypercholesterolemia.  7. Osteoporosis.  8. Chronic interstitial cystitis.  9. Recent pneumonia, treated by the patients medical doctor. 10. History of shingles. 11. Postoperative urinary retention, resolved at discharge.  PROCEDURES:  On September 10, 1999, the patient underwent open rotator cuff repair, acromioplasty, bursectomy, irrigation of glenohumeral joint performed by Dr. Shelle Iron, assisted by Avel Peace, P.A.C., under general anesthesia.  CONSULTATIONS:  Urology.  HISTORY OF PRESENT ILLNESS:  The patient is a 77 year old female with documented rotator cuff tear per MRI.  Surgical intervention was indicated, and she was admitted for the procedure stated above.  HOSPITAL COURSE:  The patient was noted to have a massive rotator cuff tear intraoperatively and was placed in abduction pillow splint intraoperatively. She received occupational therapy for ADLs postoperatively.  The patient had difficulty with urinary retention on the first postoperative day, and a urology consult was obtained.  The patient is a regular patient of Dr.  Wanda Plump, with a history of interstitial cystitis and has been treated chronically for this problem.  Prior to discharge, the patient was seen by the urologist on call for Dr. Wanda Plump and felt to be voiding well.  She did have a follow-up appointment on September 18, 1999, with Dr. Wanda Plump, and at that time it was felt her post voiding residual could be checked.  The patient utilized PCA morphine initially for her pain control, however, she was gradually weaned from this without difficulties and utilized oral analgesics prior to discharge.  On her second postoperative day, she was much more comfortable and was voiding without difficulty.  Her dressing was changed, and her wound was healing well.  She was afebrile, with vital signs stable.  Neurovascular motor function was intact in the upper extremities.  It was felt she could be discharged to her home for continuation of her recovery process.  CONDITION ON DISCHARGE:  Stable.  LABORATORY DATA:  CBC on admission normal.  Coagulation screen normal.  CMET within normal limits.  Urinalysis on admission was normal with the exception of trace leukocyte esterase.  PLAN:  The patient will resume a regular diet.  She will be up as tolerated, wearing the abduction pillow splint at all times.  She is to have no range of motion of the shoulder.  The patient will keep her wound dry and clean and do daily dressing changes.  She was given 4 x 4s and tape to do this at home.  DISCHARGE MEDICATIONS: 1. Robaxin 500 mg 1 every eight hours as  needed for spasm. 2. Percocet 5 mg 1-2 every four to six hours as needed for pain.  DISCHARGE INSTRUCTIONS:  She will be accompanied by her husband at home.  She had her husband have been advised to call the office if there are questions or concerns prior to her return office visit in two weeks with Dr. Shelle Iron. DD:  10/24/99 TD:  10/27/99 Job: [id number]

## 2010-07-11 NOTE — H&P (Signed)
[facility]  Patient:    Lacey [name], Lacey [name]                      MRN: [id number] Dictator:   Alexzandrew L. Perkins, P.A.-C.                         History and Physical  DATE OF BIRTH:  February 05, 1920. DD:  09/05/99 TD:  09/05/99 Job: [id number]

## 2010-07-11 NOTE — Assessment & Plan Note (Signed)
[facility]                                   ON-CALL NOTE   NAME:Villarruel, CANDRA WEGNER                       MRN:          [id number]  DATE:12/29/2005                            DOB:          [birth year]    Called from number [phone] at 9:57 p.m. on December 29, 2005, stating that  her blood pressure has been funning very high, about 200/97.  It was as high  as 227/100 and something, she could not remember, this afternoon.  She said  she did call Dr. Claris Che office and was waiting for a call back after talking  to the nurse, but has not yet heard from anybody and was concerned.  She has  no headaches, shortness of breath or chest pain.  She states she can feel  her pulse beating harder than usual, but no other symptoms.  The patient  refused to go to the hospital secondary to the fact that she was there a few  days ago and they just gave her some pills, and then Dr. Clent Ridges gave her a  prescription for them.  So, she does not want to sit in the emergency room.  She is asking if she can take another pill and wait and see if it comes  down.  The pills she is talking about are Clonidine 0.1 mg b.i.d.  The  patient got the bottle from me so she could tell me what they were.  Since  the patient is refusing to go to the emergency room, I told her she could  try to take an extra Clonidine 0.1.  We will recheck her blood pressure in  about a half hour to 45 minutes, but if she develops any symptoms or if her  blood pressures do not come down, she will need to call 911 and go to the  emergency room since she has nobody to take her to the hospital.  The  patient agreed that if after taking the clonidine her blood pressure did not  come down at all, she would go to the emergency room.  Otherwise, the  patient will follow up with Dr. Clent Ridges in the morning.     Lelon Perla, DO  Electronically Signed    Shawnie Dapper  DD: 12/29/2005  DT: 12/30/2005  Job #: [id number]   cc:    Tera Mater. Clent Ridges, MD

## 2010-07-11 NOTE — Assessment & Plan Note (Signed)
[facility]                                   ON-CALL NOTE   NAME:Lacey [name], Lacey [name]                         MRN:          [id number]  DATE:01/01/2006                            DOB:          1919/11/06    The patient was supposed to call Dr. Clent Ridges this afternoon about her blood  pressure readings because she has been to the emergency room I believe two  times in regard to elevated blood pressure and anxiety for which she is  under treatment. However, she had taken a nap, and by the time she woke up,  it was after hours so she called anyway. Her blood pressure in the morning  this morning was in 140s to 150s; however, this evening it was in the178  range. Since the emergency room visit, she has been on clonidine which has  been doubled as per Dr. Clent Ridges and is still on her ACE inhibitor 20 mg a day; I  believe it was Prinivil, but I am not sure, 20 mg. She has no new symptoms.  I have recommended she call Dr. Clent Ridges tomorrow to be seen on call in the  Saturday clinic. However, because her blood pressure is elevated now, I  recommend she go ahead and either take an extra dose of her ACE inhibitor or  take her Xanax that she usually takes at night for anxiety, and if it does  not bring the blood pressure down, then take the extra dose of  antihypertensive medication and call Dr. Clent Ridges tomorrow about other  recommendations. She can call back as needed.    ______________________________  Neta Mends. Fabian Sharp, MD    WKP/MedQ  DD: 01/01/2006  DT: 01/02/2006  Job #: [id number]

## 2010-07-11 NOTE — Assessment & Plan Note (Signed)
[facility]                              [place] OFFICE NOTE   NAME:Lacey [name], Lacey [name]                       MRN:          [id number]  DATE:12/24/2005                            DOB:          August 21, 1919    This is an 77 -year-old woman here for complete physical examination.  She  has a couple of problems that she would like to address. First off we saw  her on October 26 after several weeks of labile blood pressures.  Her blood  pressure here in the clinic that day was 192/110, although she felt fairly  well.  We added clonidine to her prior medications and she has tolerated it  quite well. We also increased the dosing on her Xanax for her anxiety and  she is very pleased with these results.  The main thing she would like to  address today is neuropathy.  She has described burning type pains in her  feet and hands over the past several years.  At one point earlier this year  we tried Lyrica at a low dose.  She cannot tolerate this at all however due  to extreme sedation.  She would like to try something else.  Otherwise, we  have been following her for degenerative arthritis, varicose veins in the  leg, a mild plaquing in carotid arteries, hyperlipidemia, hypertension,  allergies and osteoporosis.   PAST MEDICAL HISTORY, FAMILY HISTORY, SOCIAL HISTORY, HABITS, ETC.:  Refer  to her preoperative clearance noted for her dated April 17 of this year.  One major change in her social history is the death of her husband several  months ago after a series of illnesses.  She seems to be doing fairly well  from that, although it was difficult at first, she has a wide social support  network of friends and her church.   ALLERGIES:  ALMOST ALL NON-STEROIDAL ANTI-INFLAMMATORY MEDICATIONS.   CURRENT MEDICATIONS:  1. Aspirin 81 mg daily.  2. Zestril 20 mg daily.  3. Multivitamins including calcium and vitamin D daily.  4. Protonix 40 mg daily.  5. Actonel 35  mg weekly .  6. Astelin spray 2 sprays in each nostril b.i.d.  7. Xanax 1 mg t.i.d.  8. Crestor 20 mg per day.  9. Adaptogen daily.  10.Carafate as needed.  11.Extra-Strength Tylenol as needed.  12.GlycoLax powder as needed.  13.And most recently clonidine 0.1 mg b.i.d.   OBJECTIVE:  Height 5 feet 2 inches. Weight 141.  Blood pressure 112/64.  Pulse 60 and regular.  GENERAL:  She appears to be at her baseline which is somewhat overweight.  SKIN:  Is clear.  EYES:  Clear.  Oropharynx clear.  NECK:  Clear without lymphadenopathy or masses.  LUNGS:  Are clear.  CARDIAC:  Regular rate and rhythm regular without gallops, murmurs, distal  pulses are full.  BREASTS AND AXILLAE:  Are clear.  ABDOMEN:  Soft, normal bowel sounds, nontender, no masses.  PELVIC:  External genitalia mildly atrophic.  RECTAL:  No masses or tenderness. Stool Hemoccult negative.  EXTREMITIES:  No clubbing, cyanosis or  edema.  NEUROLOGIC:  Grossly intact.   EKG:  Shows sinus rhythm with right bundle branch block which is at her  baseline.   ASSESSMENT AND PLAN:  1. Complete physical.  Patient will set up her own mammogram and we will      get some routine laboratories today.  2. Hypertension now well controlled.  3. Anxiety - stable.  4. Hyperlipidemia.  We will check a fasting lipid panel today.  5. Gastroesophageal reflux disease - stable.  6. Degenerative arthritis - stable.  7. Neuropathy.  Will try Neurontin 300 mg b.i.d. and I will follow up with      her in 1 month.    ______________________________  Tera Mater. Clent Ridges, MD    SAF/MedQ  DD: 12/25/2005  DT: 12/25/2005  Job #: [id number]

## 2010-07-15 ENCOUNTER — Encounter: Payer: Self-pay | Admitting: Cardiology

## 2010-07-23 NOTE — Discharge Summary (Signed)
Lacey Stone, MCMURRY                ACCOUNT NO.:  [id number]  MEDICAL RECORD NO.:  [id number]           PATIENT TYPE:  I  LOCATION:  6741                         FACILITY:  [facility]  PHYSICIAN:  Calvert Cantor, M.D.     DATE OF BIRTH:  [birth year]  DATE OF ADMISSION:  06/29/2010 DATE OF DISCHARGE:  07/01/2010                              DISCHARGE SUMMARY   PRIMARY CARE PHYSICIAN:  Jeannett Senior A. Clent Ridges, MD  PRIMARY COMPLAINT:  Bleeding per rectum.  DISCHARGE DIAGNOSES: 1. Rectal bleed.  No further episodes during hospital stay, most     likely causes hemorrhoidal. 2. Accelerated hypertension resolved.  PAST MEDICAL HISTORY: 1. Recent CVA. 2. Gastroesophageal reflux disease. 3. Anxiety. 4. Early dementia with short-term memory loss.  DISCHARGE MEDICATIONS:  No medication changes have been made.  The patient can continue on the following, 1. Aspirin 325 mg daily. 2. Clonidine 0.1 mg twice a day. 3. Docusate sodium 100 mg twice a day. 4. Pantoprazole 40 mg daily. 5. Simvastatin 40 mg daily.  HOSPITAL COURSE:  This is an 77 year old female who came to the hospital with complaints of blood mixed with her stools.  She had a rectal exam in the ER, which noted bright red blood in her rectal vault.  She was subsequently admitted and frequent CBCs were ordered and she was monitored for further bleeding.  Over the past 2 days, the patient has not had any blood, in fact, she has not had any bowel movement.  We have been watching her hemoglobin closely and have not noted any drop, she was 11.9 on admission and is 11 on discharge today.  Upon obtaining further history from the patient's son, it seems that she has had these episodes in the past.  Her gastroenterologist is aware of this.  She was previously thought to have hemorrhoids and was prescribed a suppository for these hemorrhoids to help stop her bleeding.  Looking back at an old colonoscopy, I see that she has diverticula as well.   At this point, I am suspecting this was a hemorrhoidal bleed, if it was diverticular, blood loss may have been larger especially pure blood, possibly not mixed with stool and may have been more prolonged.  As it seems that her bleeding is usually when she has a bowel movement, it is most likely hemorrhoidal.  She does not complain of any hemorrhoidal pain.  Since the bleeding has stopped, currently I am not going to prescribe any medication and I have not adjusted or held her aspirin as she did recently have a CVA.  I do believe that she needs to be followed closely as an outpatient. She does live in an assisted living facility and Dr. Clent Ridges has been managing her there.  I recommend that her stools be followed.  If she has any further blood noted in her stool, I recommend that she be given something for hemorrhoids and see if this resolves.  Obviously if it is excessive bleeding, then she should come back to the hospital.  PHYSICAL EXAMINATION:  GENERAL:  The patient has been eating and drinking and ambulating well. VITAL  SIGNS:  Apparently blood pressure was high on admission at 200/110 and has since been essentially normal and in fact slightly low this morning at 105/63. LUNGS:  Clear. HEART:  Regular rate and rhythm.  No murmurs. ABDOMEN:  Soft, nontender, nondistended.  Bowel sounds positive. EXTREMITIES:  No cyanosis, clubbing, or edema.  LABORATORY DATA:  Other notable blood work; BUN and creatinine on admission were 21 and 0.89 signifying dehydration, but these have improved to 18 and 1.04.  The patient is stable for discharge.  Time on discharge was 50 minutes.     Calvert Cantor, M.D.     SR/MEDQ  D:  07/01/2010  T:  07/01/2010  Job:  [id number]  cc:   Jeannett Senior A. Clent Ridges, MD  Electronically Signed by Calvert Cantor M.D. on 07/23/2010 09:17:59 AM

## 2010-07-24 ENCOUNTER — Encounter: Payer: Self-pay | Admitting: Cardiology

## 2010-07-28 ENCOUNTER — Ambulatory Visit (INDEPENDENT_AMBULATORY_CARE_PROVIDER_SITE_OTHER): Payer: Medicare PPO | Admitting: Cardiology

## 2010-07-28 ENCOUNTER — Encounter: Payer: Self-pay | Admitting: Cardiology

## 2010-07-28 DIAGNOSIS — I6529 Occlusion and stenosis of unspecified carotid artery: Secondary | ICD-10-CM

## 2010-07-28 DIAGNOSIS — I639 Cerebral infarction, unspecified: Secondary | ICD-10-CM

## 2010-07-28 DIAGNOSIS — I6350 Cerebral infarction due to unspecified occlusion or stenosis of unspecified cerebral artery: Secondary | ICD-10-CM

## 2010-07-28 MED ORDER — HYDROCORTISONE ACETATE 25 MG RE SUPP: 25.0000 mg | Freq: Three times a day (TID) | RECTAL | Status: AC | PRN

## 2010-07-28 NOTE — Assessment & Plan Note (Signed)
I reviewed the hospital data concerning her neurologic event.  She had a monitor showing no atrial fib.  Aspirin is to be continued.

## 2010-07-28 NOTE — Patient Instructions (Signed)
Your physician wants you to follow-up in:  6 months. You will receive a reminder letter in the mail two months in advance. If you don't receive a letter, please call our office to schedule the follow-up appointment.   

## 2010-07-28 NOTE — Assessment & Plan Note (Signed)
Patient has carotid artery disease and this will need to be followed.

## 2010-07-28 NOTE — Progress Notes (Signed)
HPI Patient is seen today to followup her cardiac disease.  She had been hospitalized with a CVA in February, 2012.  She had short-term memory problems and some slurred speech.  The slurred speech improved.  She is here with her son today.  He is very knowledgeable about her case.  Her speech is greatly improved.  I have reviewed the hospital records.  She had presented with weakness.  She had some slurred speech.  MRI showed multiple small areas of acute infarct in the cerebral white matter bilaterally.  This suggested emboli.  There was no proof of atrial fibrillation area 2-D echo showed no obvious source of embolus.  TE did not show any source of embolus.  Carotid Dopplers revealed 40-59% disease on the left.  Patient was put on aspirin.  The plan was for her to have an outpatient monitor.  She was also to see neurology in followup.  She had a 21 day recorder.  I reviewed the results personally.  She had sinus rhythm.  There is no centimeter fibrillation.  She returns today she is stable. Allergies  Allergen Reactions  . Diclofenac Sodium     REACTION: unspecified  . Naproxen     REACTION: unspecified    Current Outpatient Prescriptions  Medication Sig Dispense Refill  . aspirin 325 MG tablet Take 325 mg by mouth daily.        . Calcium Carbonate-Vitamin D (CALTRATE 600+D) 600-400 MG-UNIT per tablet Take 1 tablet by mouth daily.        . cloNIDine (CATAPRES) 0.1 MG tablet Take 1 tablet (0.1 mg total) by mouth 2 (two) times daily.  180 tablet  1  . lisinopril (PRINIVIL,ZESTRIL) 20 MG tablet Take 20 mg by mouth daily.        . pantoprazole (PROTONIX) 40 MG tablet Take 40 mg by mouth daily.        . simvastatin (ZOCOR) 40 MG tablet Take 40 mg by mouth at bedtime.        . vitamin E 400 UNIT capsule Take 400 Units by mouth daily.        Marland Kitchen ALPRAZolam (XANAX) 1 MG tablet Take 1 mg by mouth daily.        . fluticasone (FLONASE) 50 MCG/ACT nasal spray 1 spray by Nasal route daily.        .  hydrocortisone (ANUSOL-HC) 25 MG suppository Place 25 mg rectally 3 (three) times daily as needed.        Marland Kitchen DISCONTD: gabapentin (NEURONTIN) 300 MG capsule Take 300 mg by mouth 3 (three) times daily.        Marland Kitchen DISCONTD: omeprazole (PRILOSEC) 20 MG capsule Take 20 mg by mouth daily.        Marland Kitchen DISCONTD: oxybutynin (DITROPAN) 5 MG tablet Take 5 mg by mouth 3 (three) times daily.        Marland Kitchen DISCONTD: sucralfate (CARAFATE) 1 G tablet Take 1 g by mouth 3 (three) times daily as needed.          History   Social History  . Marital Status: Widowed    Spouse Name: N/A    Number of Children: N/A  . Years of Education: N/A   Occupational History  . Not on file.   Social History Main Topics  . Smoking status: Former Games developer  . Smokeless tobacco: Not on file  . Alcohol Use: No  . Drug Use: No  . Sexually Active: Not on file   Other Topics Concern  .  Not on file   Social History Narrative  . No narrative on file    Family History  Problem Relation Age of Onset  . Cancer Mother     sinus  . Heart disease Father     Past Medical History  Diagnosis Date  . HYPERLIPIDEMIA 06/29/2006  . HYPERTENSION 06/29/2006  . GERD 06/29/2006    Esophageal dilatation in the past  . INTERSTITIAL CYSTITIS 06/29/2006  . OSTEOPOROSIS 06/29/2006  . INSOMNIA, HX OF 06/29/2006  . ANXIETY DISORDER, GENERALIZED 10/20/2006  . XEROSTOMIA 01/24/2007  . HEMORRHOIDS, EXTERNAL 01/17/2008  . RECTAL BLEEDING 01/17/2008  . ANEMIA-IRON DEFICIENCY 07/26/2008  . ANXIETY 07/26/2008  . RBBB 11/06/2008  . CAROTID ARTERY DISEASE 11/06/2008    Doppler, September, 2010, 40-59% R. ICA, 60- 79% LICA  . CHEST PAIN 11/06/2008  . CVA (cerebral infarction)     February, 2012 hospitalized [facility].    Past Surgical History  Procedure Date  . Abdominal hysterectomy     ROS  Patient denies fever, chills, headache, sweats, rash, change in vision, change in hearing, chest pain, cough, nausea vomiting, urinary symptoms.  All other systems are  reviewed and are negative.  PHYSICAL EXAM Patient is oriented to person and place.   She is here with her son.  During the interview she showed definite signs of short-term memory loss.  There is no head trauma.  There is no jugular venous distention.  Lungs are clear.  Respiratory effort is unlabored.  Cardiac exam reveals S1 and S2.  There are no clicks or significant murmurs.  Abdomen is soft.  There is no peripheral edema. Filed Vitals:   07/28/10 1334  BP: 128/78  Pulse: 76  Height: 5\' 3"  (1.6 m)  Weight: 132 lb (59.875 kg)    EKG  EKG is done today and reviewed by me.  She has old right bundle branch block.  There is no significant change.  ASSESSMENT & PLAN

## 2010-11-25 LAB — POCT I-STAT, CHEM 8
BUN: 16 mg/dL (ref 6–23)
Creatinine, Ser: 1.3 mg/dL — ABNORMAL HIGH (ref 0.4–1.2)
Glucose, Bld: 97 mg/dL (ref 70–99)
Potassium: 4.4 mEq/L (ref 3.5–5.1)
Sodium: 139 mEq/L (ref 135–145)

## 2010-12-08 LAB — I-STAT 8, (EC8 V) (CONVERTED LAB)
BUN: 13
Chloride: 106
Glucose, Bld: 129 — ABNORMAL HIGH
HCT: 35 — ABNORMAL LOW
Hemoglobin: 11.9 — ABNORMAL LOW
Operator id: 294501
pCO2, Ven: 35.8 — ABNORMAL LOW

## 2010-12-08 LAB — URINALYSIS, ROUTINE W REFLEX MICROSCOPIC
Bilirubin Urine: NEGATIVE
Glucose, UA: NEGATIVE
Hgb urine dipstick: NEGATIVE
Ketones, ur: NEGATIVE
pH: 7.5

## 2010-12-08 LAB — CBC
HCT: 33.5 — ABNORMAL LOW
Hemoglobin: 11.6 — ABNORMAL LOW
MCHC: 34.7
MCV: 89
RBC: 3.77 — ABNORMAL LOW
RDW: 12.4

## 2010-12-08 LAB — DIFFERENTIAL
Basophils Absolute: 0
Basophils Relative: 0
Eosinophils Absolute: 0.1
Eosinophils Relative: 1
Monocytes Absolute: 0.5
Monocytes Relative: 8

## 2010-12-08 LAB — URINE MICROSCOPIC-ADD ON

## 2010-12-08 LAB — POCT CARDIAC MARKERS
CKMB, poc: 1.3
Operator id: 294501

## 2011-01-27 ENCOUNTER — Ambulatory Visit: Payer: Medicare PPO | Admitting: Cardiology

## 2012-02-16 IMAGING — CT CT ABD-PELV W/O CM
1 of 2 series · 15 of 32 positions shown, 19 images · non-contrast
Comparison: Ultrasound 04/10/2010, MRI liver 01/09/2008

CLINICAL DATA: Evaluate left hydronephrosis.  Chronic interstitial
cystitis.

CT ABDOMEN AND PELVIS WITHOUT CONTRAST
TECHNIQUE: Multidetector CT imaging of the abdomen and pelvis was
performed following the standard protocol without intravenous
contrast.

[Series 2: abd/pelv w/o 5.0 b31f st · axial · non-contrast · 0.82mm/px · z∈[-424,-54]mm · 15 of 82 slices shown, 19 images]
[im 4/82  soft-tissue]
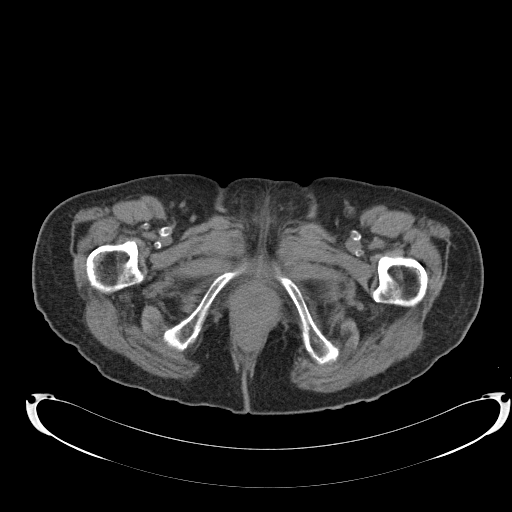
[im 4/82  bone]
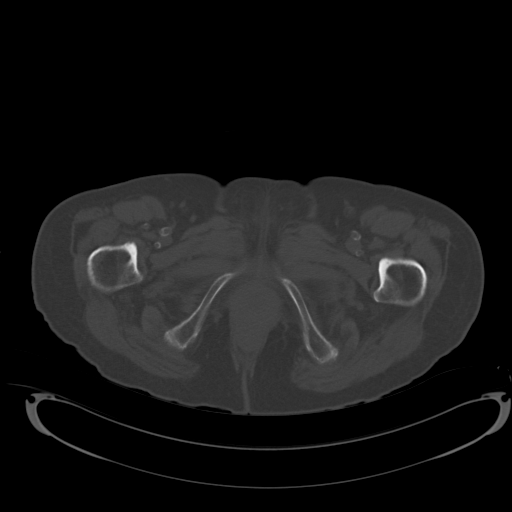
[im 10/82  soft-tissue]
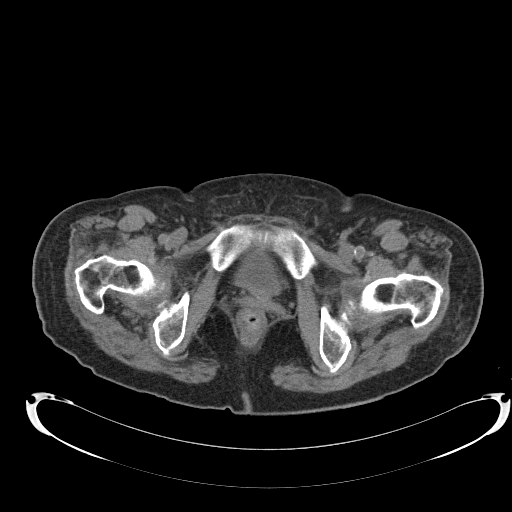
[im 16/82  soft-tissue]
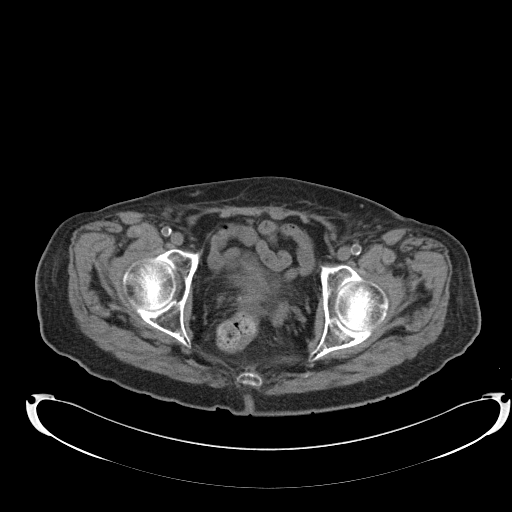
[im 22/82  soft-tissue]
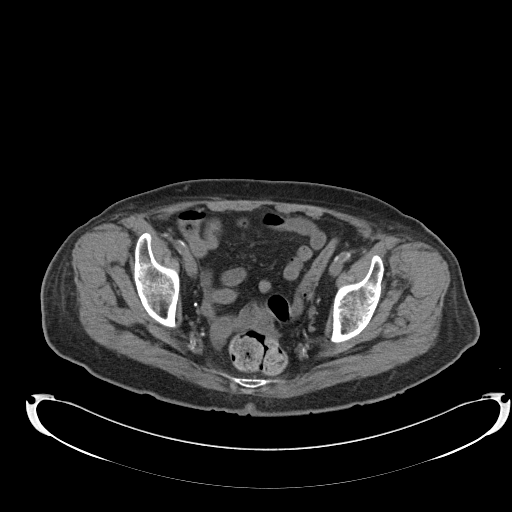
[im 29/82  soft-tissue]
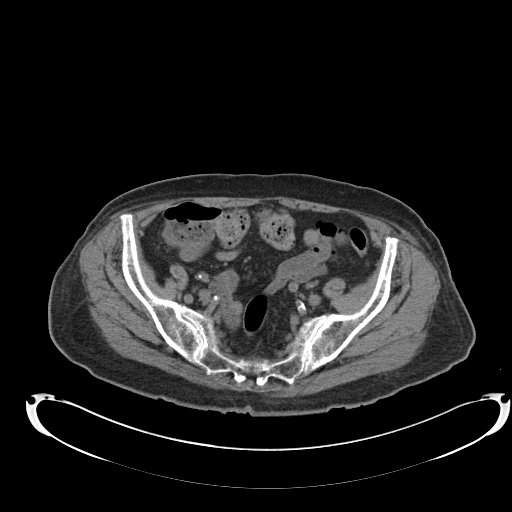
[im 35/82  soft-tissue]
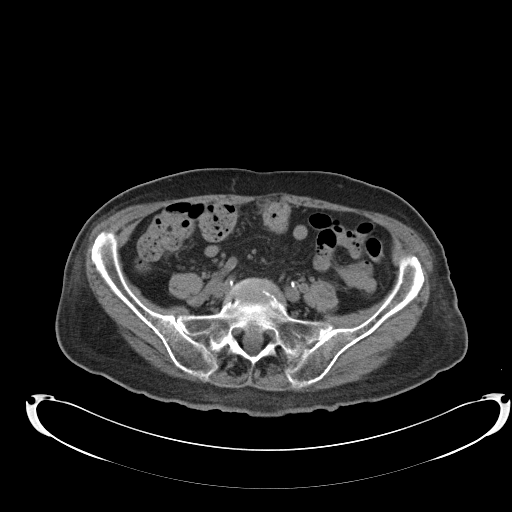
[im 41/82  soft-tissue]
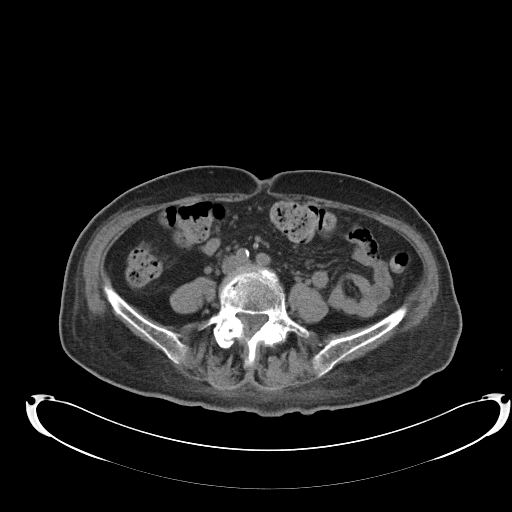
[im 47/82  soft-tissue]
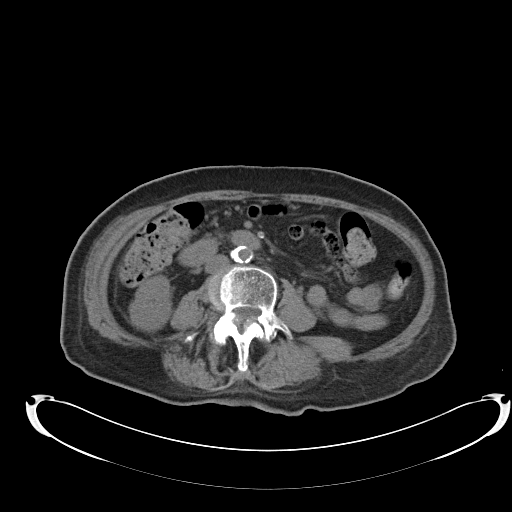
[im 53/82  soft-tissue]
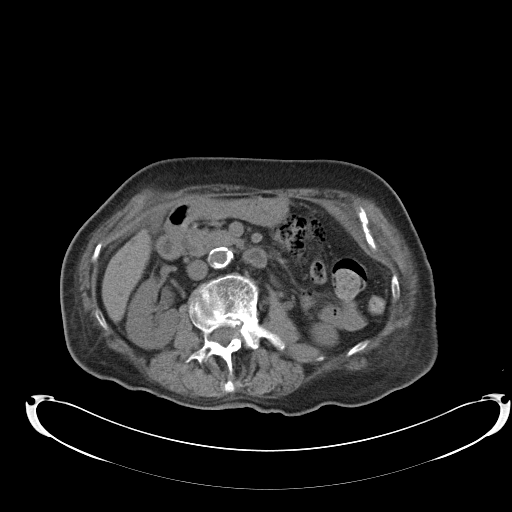
[im 53/82  bone]
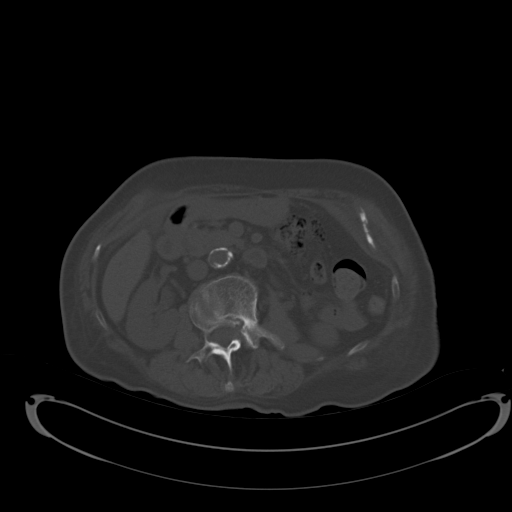
[im 60/82  soft-tissue]
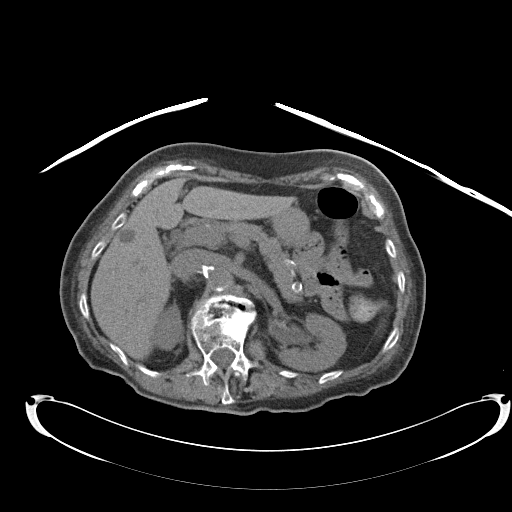
[im 66/82  soft-tissue]
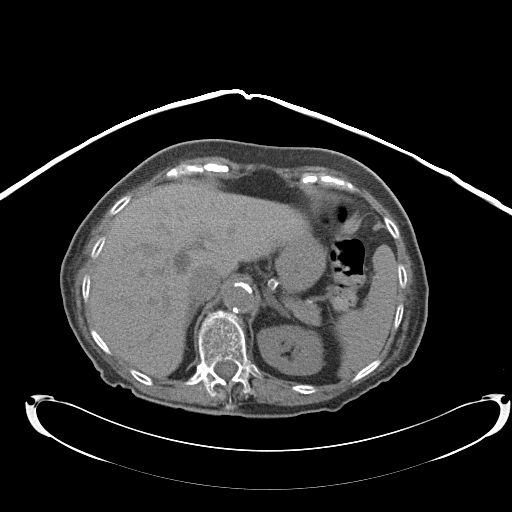
[im 69/82  lung]
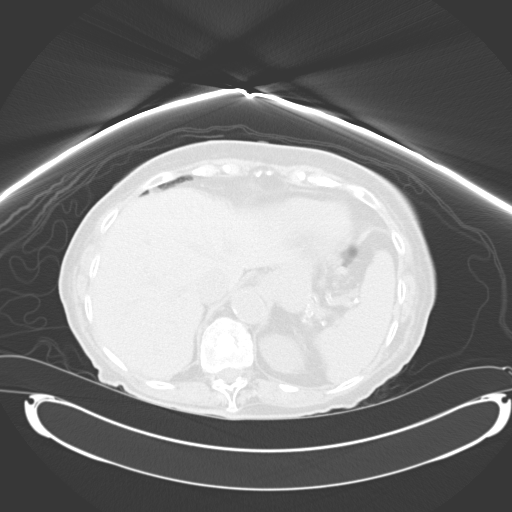
[im 72/82  soft-tissue]
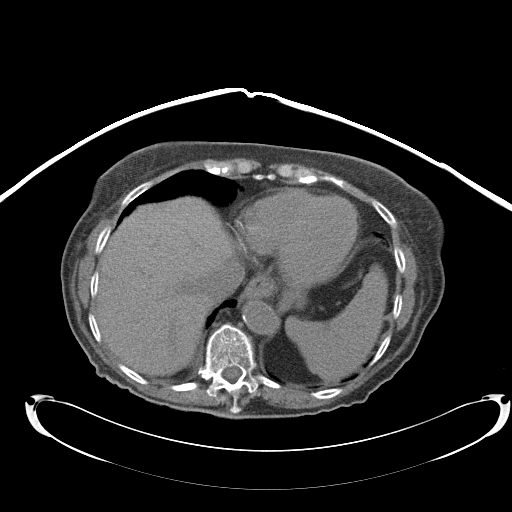
[im 72/82  lung]
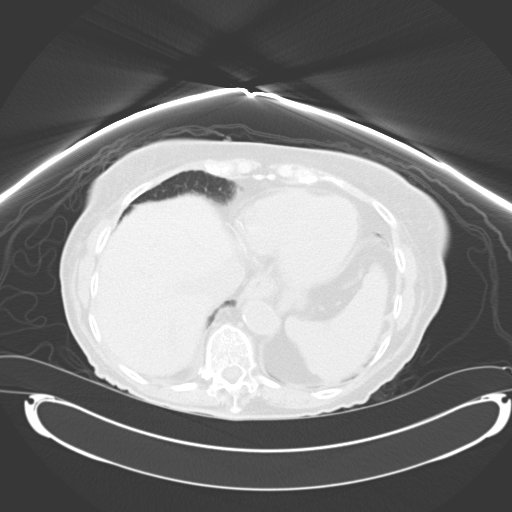
[im 75/82  lung]
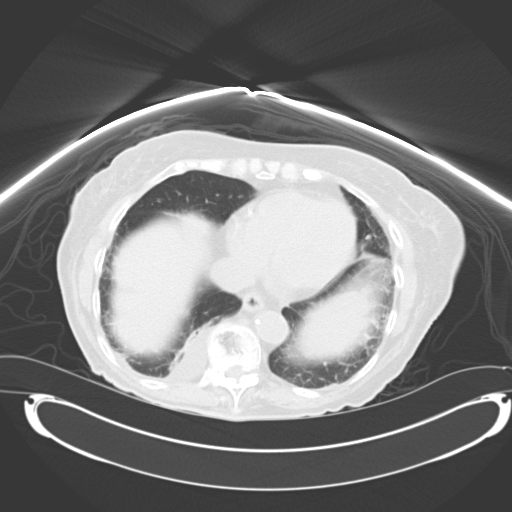
[im 78/82  soft-tissue]
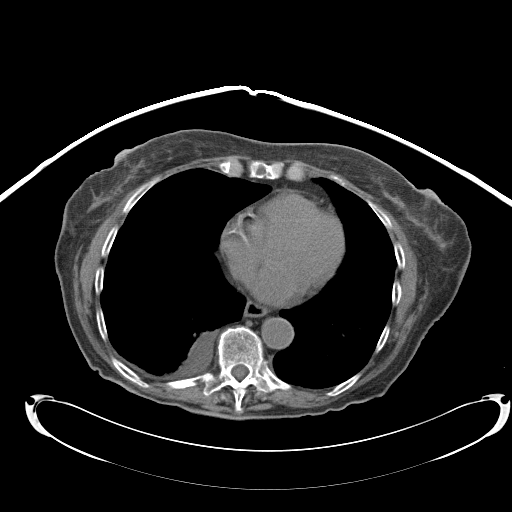
[im 78/82  lung]
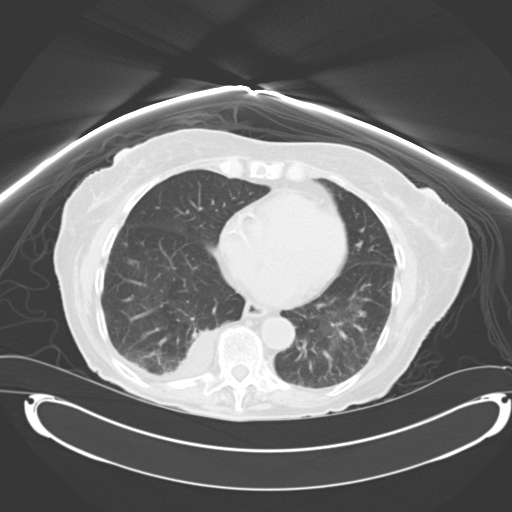

[15 of 32 positions shown; findings below may reference images not displayed]

FINDINGS: Mild left hydronephrosis as identified on the ultrasound.
Proximal left ureter is mildly dilated.  No mass or ureteral stone
is identified.  Mild bladder wall thickening is present.  No renal
calculi.  No obstruction on the right.  No renal mass is
identified.

Benign hepatic cysts are noted and unchanged from the MRI.
Pancreas and spleen are normal.  Atherosclerotic calcification is
present in the aorta and visceral vessels without aneurysm.

Negative for mass or adenopathy.  Negative for bowel obstruction or
thickening.  No free fluid in the pelvis.

Dextroscoliosis in the lumbar spine  with  multiple levels of disc
degeneration.  Mild anterior slip L4-L5.  Small right pleural
effusion
IMPRESSION: Mild left hydronephrosis is present without identified cause.  No
calculus or mass is present.

Benign hepatic cysts.

## 2014-08-24 ENCOUNTER — Encounter: Payer: Self-pay | Admitting: Gastroenterology

## 2015-04-19 ENCOUNTER — Emergency Department (HOSPITAL_COMMUNITY)
Admission: EM | Admit: 2015-04-19 | Discharge: 2015-04-19 | Discharge status: Absent without leave | Payer: Self-pay | Source: Home / Self Care

## 2015-04-19 ENCOUNTER — Encounter (HOSPITAL_COMMUNITY): Payer: Self-pay | Admitting: Family Medicine

## 2015-04-19 ENCOUNTER — Emergency Department (INDEPENDENT_AMBULATORY_CARE_PROVIDER_SITE_OTHER): Payer: PPO

## 2015-04-19 ENCOUNTER — Emergency Department (HOSPITAL_COMMUNITY)
Admission: EM | Admit: 2015-04-19 | Discharge: 2015-04-19 | Disposition: A | Discharge status: Home / Self Care | Payer: PPO | Source: Home / Self Care | Attending: Family Medicine | Admitting: Family Medicine

## 2015-04-19 DIAGNOSIS — R509 Fever, unspecified: Secondary | ICD-10-CM | POA: Diagnosis not present

## 2015-04-19 DIAGNOSIS — J111 Influenza due to unidentified influenza virus with other respiratory manifestations: Secondary | ICD-10-CM

## 2015-04-19 DIAGNOSIS — R05 Cough: Secondary | ICD-10-CM | POA: Diagnosis not present

## 2015-04-19 MED ORDER — ONDANSETRON HCL 4 MG PO TABS: 4.0000 mg | ORAL_TABLET | Freq: Three times a day (TID) | ORAL | Status: AC | PRN

## 2015-04-19 MED ORDER — OSELTAMIVIR PHOSPHATE 75 MG PO CAPS: 75.0000 mg | ORAL_CAPSULE | Freq: Two times a day (BID) | ORAL | Status: DC

## 2015-04-19 NOTE — Discharge Instructions (Signed)
Lacey Stone likely has the flu. Please start the tamiflu Please use the zofran as needed for nausea Please have her seen again by a doctor if she develops any low oxygen levels  (below 90%), confusion, worsening fevers, or difficulty breathing.  There is a chance that she still may develop a pneumonia

## 2015-04-19 NOTE — ED Notes (Signed)
C/o cold sx onset x2 days associated w/congestion, prod cough, SOB and fever Lives at a nursing facility... Son at bedside A&O x4... No acute distress.

## 2015-04-19 NOTE — ED Provider Notes (Signed)
CSN: [id number]     Arrival date & time 04/19/15  1812 History   First MD Initiated Contact with Patient 04/19/15 1830     Chief Complaint  Patient presents with  . Hematuria   (Consider location/radiation/quality/duration/timing/severity/associated sxs/prior Treatment) HPI  Cough. Started 2 days ago. Occasional wheeze per son who sees the patient on a occasion. Patient lives in independent living facility as her medications dispensed twice a day by medical staff. Fever to 11 today. Associated with some upper respiratory congestion such as runny nose. Decreased oral intake over the last 2 days. Feeling a little more drained. Denies any shortness of breath, dysuria, frequency, diarrhea, nausea, vomiting, back pain, rash.    Past Medical History  Diagnosis Date  . HYPERLIPIDEMIA 06/29/2006  . HYPERTENSION 06/29/2006  . GERD 06/29/2006    Esophageal dilatation in the past  . INTERSTITIAL CYSTITIS 06/29/2006  . OSTEOPOROSIS 06/29/2006  . INSOMNIA, HX OF 06/29/2006  . ANXIETY DISORDER, GENERALIZED 10/20/2006  . XEROSTOMIA 01/24/2007  . HEMORRHOIDS, EXTERNAL 01/17/2008  . RECTAL BLEEDING 01/17/2008  . ANEMIA-IRON DEFICIENCY 07/26/2008  . ANXIETY 07/26/2008  . RBBB 11/06/2008  . CAROTID ARTERY DISEASE 11/06/2008    Doppler, September, 2010, 40-59% R. ICA, 60- 79% LICA  . CHEST PAIN 11/06/2008  . CVA (cerebral infarction)     February, 2012 hospitalized [facility].   Past Surgical History  Procedure Laterality Date  . Abdominal hysterectomy     Family History  Problem Relation Age of Onset  . Cancer Mother     sinus  . Heart disease Father    Social History  Substance Use Topics  . Smoking status: Former Games developer  . Smokeless tobacco: None  . Alcohol Use: No   OB History    No data available     Review of Systems Per HPI with all other pertinent systems negative.   Allergies  Diclofenac sodium and Naproxen  Home Medications   Prior to Admission medications   Medication Sig Start Date End  Date Taking? Authorizing Provider  ALPRAZolam Prudy Feeler) 1 MG tablet Take 1 mg by mouth daily.      Historical Provider, MD  aspirin 325 MG tablet Take 325 mg by mouth daily.      Historical Provider, MD  Calcium Carbonate-Vitamin D (CALTRATE 600+D) 600-400 MG-UNIT per tablet Take 1 tablet by mouth daily.      Historical Provider, MD  cloNIDine (CATAPRES) 0.1 MG tablet Take 1 tablet (0.1 mg total) by mouth 2 (two) times daily. 04/10/10   Nelwyn Salisbury, MD  fluticasone (FLONASE) 50 MCG/ACT nasal spray 1 spray by Nasal route daily.      Historical Provider, MD  hydrocortisone (ANUSOL-HC) 25 MG suppository Place 1 suppository (25 mg total) rectally 3 (three) times daily as needed. 07/28/10   Luis Abed, MD  lisinopril (PRINIVIL,ZESTRIL) 20 MG tablet Take 20 mg by mouth daily.      Historical Provider, MD  ondansetron (ZOFRAN) 4 MG tablet Take 1 tablet (4 mg total) by mouth every 8 (eight) hours as needed for nausea or vomiting. 04/19/15   Ozella Rocks, MD  oseltamivir (TAMIFLU) 75 MG capsule Take 1 capsule (75 mg total) by mouth 2 (two) times daily. 04/19/15   Ozella Rocks, MD  pantoprazole (PROTONIX) 40 MG tablet Take 40 mg by mouth daily.      Historical Provider, MD  simvastatin (ZOCOR) 40 MG tablet Take 40 mg by mouth at bedtime.      Historical Provider, MD  vitamin E 400 UNIT capsule Take 400 Units by mouth daily.      Historical Provider, MD   Meds Ordered and Administered this Visit  Medications - No data to display  BP 152/88 mmHg  Pulse 104  Temp(Src) 101 F (38.3 C) (Oral)  Resp 18  SpO2 93% No data found.   Physical Exam Physical Exam  Constitutional: oriented to person, place, and time. appears well-developed and well-nourished. No distress.  HENT:  Head: Normocephalic and atraumatic.  Eyes: EOMI. PERRL.  Neck: Normal range of motion.  Cardiovascular: RRR, no m/r/g, 2+ distal pulses,  Pulmonary/Chest: Effort normal and breath sounds normal. No respiratory distress.  few  atelectatic type crackles in bilateral lung bases. Abdominal: Soft. Bowel sounds are normal. NonTTP, no distension.  Musculoskeletal: Normal range of motion. Non ttp, no effusion.  Neurological: alert and oriented to person, place, and time.  Skin: Skin is warm. No rash noted. non diaphoretic.  Psychiatric: normal mood and affect. behavior is normal. Judgment and thought content normal.   ED Course  Procedures (including critical care time)  Labs Review Labs Reviewed - No data to display  Imaging Review Dg Chest 2 View  04/19/2015  CLINICAL DATA:  82 year old nursing home patient with 2 day history of cough and fever. EXAM: CHEST  2 VIEW COMPARISON:  04/10/2010 and earlier. FINDINGS: AP erect and lateral images were obtained. Cardiac silhouette normal in size, unchanged. Thoracic aorta tortuous and atherosclerotic, unchanged. Hilar and mediastinal contours otherwise unremarkable. Minimal scarring in the lower lobes, left greater than right, unchanged. Lungs otherwise clear. Bronchovascular markings normal. Pulmonary vascularity normal. No visible pleural effusions. No pneumothorax. Degenerative changes throughout the thoracic spine with exaggeration of the usual thoracic kyphosis. IMPRESSION: No acute cardiopulmonary disease. Electronically Signed   By: Hulan Saas M.D.   On: 04/19/2015 19:06     Visual Acuity Review  Right Eye Distance:   Left Eye Distance:   Bilateral Distance:    Right Eye Near:   Left Eye Near:    Bilateral Near:         MDM   1. Flu syndrome    Chest x-ray without evidence of pneumonia. Suspect patient has the flu. Start Tamiflu. Zofran given for nausea relief. Of note patient is quite elderly with significant chronic comorbidities and recommending family keep a very close eye and the patient for next couple days as she still may develop a ammonia. Currently patient is stable and okay for discharge home. Patient appears to have excellent support by  family members at home. All questions answered  I have verbally reviewed the discharge instructions with the patient. A printed AVS was given to the patient.  All questions were answered prior to discharge.      Ozella Rocks, MD 04/19/15 [id number]

## 2015-04-19 NOTE — ED Notes (Signed)
D/c by Shelly Flatten, md

## 2015-06-05 DIAGNOSIS — N183 Chronic kidney disease, stage 3 (moderate): Secondary | ICD-10-CM | POA: Diagnosis not present

## 2015-06-05 DIAGNOSIS — E785 Hyperlipidemia, unspecified: Secondary | ICD-10-CM | POA: Diagnosis not present

## 2015-06-05 DIAGNOSIS — E1122 Type 2 diabetes mellitus with diabetic chronic kidney disease: Secondary | ICD-10-CM | POA: Diagnosis not present

## 2015-06-05 DIAGNOSIS — I10 Essential (primary) hypertension: Secondary | ICD-10-CM | POA: Diagnosis not present

## 2015-11-08 DIAGNOSIS — I10 Essential (primary) hypertension: Secondary | ICD-10-CM | POA: Diagnosis not present

## 2015-11-08 DIAGNOSIS — E785 Hyperlipidemia, unspecified: Secondary | ICD-10-CM | POA: Diagnosis not present

## 2015-11-08 DIAGNOSIS — N3010 Interstitial cystitis (chronic) without hematuria: Secondary | ICD-10-CM | POA: Diagnosis not present

## 2015-11-08 DIAGNOSIS — M810 Age-related osteoporosis without current pathological fracture: Secondary | ICD-10-CM | POA: Diagnosis not present

## 2015-11-08 DIAGNOSIS — Z23 Encounter for immunization: Secondary | ICD-10-CM | POA: Diagnosis not present

## 2015-11-08 DIAGNOSIS — K219 Gastro-esophageal reflux disease without esophagitis: Secondary | ICD-10-CM | POA: Diagnosis not present

## 2015-11-08 DIAGNOSIS — R413 Other amnesia: Secondary | ICD-10-CM | POA: Diagnosis not present

## 2015-11-08 DIAGNOSIS — I6350 Cerebral infarction due to unspecified occlusion or stenosis of unspecified cerebral artery: Secondary | ICD-10-CM | POA: Diagnosis not present

## 2015-11-08 DIAGNOSIS — E1122 Type 2 diabetes mellitus with diabetic chronic kidney disease: Secondary | ICD-10-CM | POA: Diagnosis not present

## 2015-12-13 DIAGNOSIS — Z1389 Encounter for screening for other disorder: Secondary | ICD-10-CM | POA: Diagnosis not present

## 2015-12-13 DIAGNOSIS — Z0000 Encounter for general adult medical examination without abnormal findings: Secondary | ICD-10-CM | POA: Diagnosis not present

## 2015-12-13 DIAGNOSIS — E1122 Type 2 diabetes mellitus with diabetic chronic kidney disease: Secondary | ICD-10-CM | POA: Diagnosis not present

## 2016-05-04 DIAGNOSIS — K219 Gastro-esophageal reflux disease without esophagitis: Secondary | ICD-10-CM | POA: Diagnosis not present

## 2016-05-04 DIAGNOSIS — E1122 Type 2 diabetes mellitus with diabetic chronic kidney disease: Secondary | ICD-10-CM | POA: Diagnosis not present

## 2016-05-04 DIAGNOSIS — E785 Hyperlipidemia, unspecified: Secondary | ICD-10-CM | POA: Diagnosis not present

## 2016-05-04 DIAGNOSIS — M810 Age-related osteoporosis without current pathological fracture: Secondary | ICD-10-CM | POA: Diagnosis not present

## 2016-05-04 DIAGNOSIS — I10 Essential (primary) hypertension: Secondary | ICD-10-CM | POA: Diagnosis not present

## 2016-05-04 DIAGNOSIS — I6350 Cerebral infarction due to unspecified occlusion or stenosis of unspecified cerebral artery: Secondary | ICD-10-CM | POA: Diagnosis not present

## 2016-05-13 ENCOUNTER — Ambulatory Visit (HOSPITAL_COMMUNITY)
Admission: EM | Admit: 2016-05-13 | Discharge: 2016-05-13 | Disposition: A | Discharge status: Home / Self Care | Payer: PPO | Attending: Emergency Medicine | Admitting: Emergency Medicine

## 2016-05-13 ENCOUNTER — Encounter (HOSPITAL_COMMUNITY): Payer: Self-pay | Admitting: Emergency Medicine

## 2016-05-13 DIAGNOSIS — J209 Acute bronchitis, unspecified: Secondary | ICD-10-CM

## 2016-05-13 MED ORDER — BENZONATATE 100 MG PO CAPS: 100.0000 mg | ORAL_CAPSULE | Freq: Three times a day (TID) | ORAL | 0 refills | Status: DC

## 2016-05-13 MED ORDER — AZITHROMYCIN 250 MG PO TABS: 250.0000 mg | ORAL_TABLET | Freq: Every day | ORAL | 0 refills | Status: DC

## 2016-05-13 MED ORDER — PREDNISONE 20 MG PO TABS: 20.0000 mg | ORAL_TABLET | Freq: Every day | ORAL | 0 refills | Status: DC

## 2016-05-13 NOTE — ED Triage Notes (Signed)
Symptoms started 2-3 days ago.  Runny nose, cough, congestion in head and some in chest.

## 2016-05-13 NOTE — ED Provider Notes (Signed)
CSN: [id number]     Arrival date & time 05/13/16  1749 History   None    Chief Complaint  Patient presents with  . Cough   (Consider location/radiation/quality/duration/timing/severity/associated sxs/prior Treatment) 83 year old female presents to clinic with chief complaint of cough, congestion, fatigue been ongoing for 5 days. She denies any past history of lung disease, states she quit smoking many years ago. He denies any fever, muscle aches, bodyaches, nausea, vomiting, diarrhea, has had some loss of appetite, no dizziness, no shortness of breath, no dysuria, no increased frequency, urgency. Additionally she has had no confusion or other symptoms. Cough is described as dry, hacking, nonproductive, with clear to yellow sputum.   The history is provided by the patient.  Cough    Past Medical History:  Diagnosis Date  . ANEMIA-IRON DEFICIENCY 07/26/2008  . ANXIETY 07/26/2008  . ANXIETY DISORDER, GENERALIZED 10/20/2006  . CAROTID ARTERY DISEASE 11/06/2008   Doppler, September, 2010, 40-59% R. ICA, 60- 79% LICA  . CHEST PAIN 11/06/2008  . CVA (cerebral infarction)    February, 2012 hospitalized [facility].  Marland Kitchen GERD 06/29/2006   Esophageal dilatation in the past  . HEMORRHOIDS, EXTERNAL 01/17/2008  . HYPERLIPIDEMIA 06/29/2006  . HYPERTENSION 06/29/2006  . INSOMNIA, HX OF 06/29/2006  . INTERSTITIAL CYSTITIS 06/29/2006  . OSTEOPOROSIS 06/29/2006  . RBBB 11/06/2008  . RECTAL BLEEDING 01/17/2008  . XEROSTOMIA 01/24/2007   Past Surgical History:  Procedure Laterality Date  . ABDOMINAL HYSTERECTOMY     Family History  Problem Relation Age of Onset  . Cancer Mother     sinus  . Heart disease Father    Social History  Substance Use Topics  . Smoking status: Former Games developer  . Smokeless tobacco: Not on file  . Alcohol use No   OB History    No data available     Review of Systems  Reason unable to perform ROS: As covered in history of present illness.  Respiratory: Positive for cough.   All other  systems reviewed and are negative.   Allergies  Diclofenac sodium and Naproxen  Home Medications   Prior to Admission medications   Medication Sig Start Date End Date Taking? Authorizing Provider  ALPRAZolam Prudy Feeler) 1 MG tablet Take 1 mg by mouth daily.      Historical Provider, MD  aspirin 325 MG tablet Take 325 mg by mouth daily.      Historical Provider, MD  azithromycin (ZITHROMAX) 250 MG tablet Take 1 tablet (250 mg total) by mouth daily. Take first 2 tablets together, then 1 every day until finished. 05/13/16   Dorena Bodo, NP  benzonatate (TESSALON) 100 MG capsule Take 1 capsule (100 mg total) by mouth every 8 (eight) hours. 05/13/16   Dorena Bodo, NP  Calcium Carbonate-Vitamin D (CALTRATE 600+D) 600-400 MG-UNIT per tablet Take 1 tablet by mouth daily.      Historical Provider, MD  cloNIDine (CATAPRES) 0.1 MG tablet Take 1 tablet (0.1 mg total) by mouth 2 (two) times daily. 04/10/10   Nelwyn Salisbury, MD  fluticasone (FLONASE) 50 MCG/ACT nasal spray 1 spray by Nasal route daily.      Historical Provider, MD  hydrocortisone (ANUSOL-HC) 25 MG suppository Place 1 suppository (25 mg total) rectally 3 (three) times daily as needed. 07/28/10   Luis Abed, MD  lisinopril (PRINIVIL,ZESTRIL) 20 MG tablet Take 20 mg by mouth daily.      Historical Provider, MD  ondansetron (ZOFRAN) 4 MG tablet Take 1 tablet (4 mg total) by  mouth every 8 (eight) hours as needed for nausea or vomiting. 04/19/15   Ozella Rocksavid J Merrell, MD  oseltamivir (TAMIFLU) 75 MG capsule Take 1 capsule (75 mg total) by mouth 2 (two) times daily. 04/19/15   Ozella Rocksavid J Merrell, MD  pantoprazole (PROTONIX) 40 MG tablet Take 40 mg by mouth daily.      Historical Provider, MD  predniSONE (DELTASONE) 20 MG tablet Take 1 tablet (20 mg total) by mouth daily with breakfast. 05/13/16   Dorena BodoLawrence Hania Cerone, NP  simvastatin (ZOCOR) 40 MG tablet Take 40 mg by mouth at bedtime.      Historical Provider, MD  vitamin E 400 UNIT capsule Take 400 Units  by mouth daily.      Historical Provider, MD   Meds Ordered and Administered this Visit  Medications - No data to display  BP (!) 170/76 (BP Location: Left Arm)   Pulse 87   Temp 98.3 F (36.8 C) (Oral)   Resp 18   SpO2 100%  No data found.   Physical Exam  Constitutional: She is oriented to person, place, and time. She appears well-developed and well-nourished. She does not have a sickly appearance. She does not appear ill. No distress.  HENT:  Head: Normocephalic and atraumatic.  Right Ear: Tympanic membrane and external ear normal.  Left Ear: Tympanic membrane and external ear normal.  Nose: Nose normal. Right sinus exhibits no maxillary sinus tenderness and no frontal sinus tenderness. Left sinus exhibits no maxillary sinus tenderness and no frontal sinus tenderness.  Mouth/Throat: Uvula is midline and oropharynx is clear and moist. No oropharyngeal exudate.  Eyes: Pupils are equal, round, and reactive to light.  Neck: Normal range of motion. Neck supple. No JVD present.  Cardiovascular: Normal rate and regular rhythm.   Pulmonary/Chest: Effort normal. No respiratory distress. She has wheezes.  Diffuse expiratory wheezing throughout all fields.  Abdominal: Soft. Bowel sounds are normal. She exhibits no distension. There is no tenderness. There is no guarding.  Lymphadenopathy:       Head (right side): No submental, no submandibular, no tonsillar and no preauricular adenopathy present.       Head (left side): No submental, no submandibular, no tonsillar and no preauricular adenopathy present.    She has no cervical adenopathy.  Neurological: She is alert and oriented to person, place, and time.  Skin: Skin is warm and dry. Capillary refill takes less than 2 seconds. She is not diaphoretic.  Psychiatric: She has a normal mood and affect. Her behavior is normal.  Nursing note and vitals reviewed.   Urgent Care Course     Procedures (including critical care time)  Labs  Review Labs Reviewed - No data to display  Imaging Review No results found.     MDM   1. Acute bronchitis, unspecified organism    24 acute bronchitis, he was a azithromycin, prednisone, Tessalon, positive follow-up with her primary care provider if symptoms persist past one week.     Dorena BodoLawrence Britt Theard, NP 05/13/16 1914

## 2016-05-13 NOTE — Discharge Instructions (Signed)
I am treating you for acute bronchitis. Prescribed azithromycin, take 2 tablets now, then 1 tablet daily until finished. Prescribed prednisone, take one tablet daily with food, and for cough and prescribed Tessalon, take one tablet every 8 hours as needed. If your symptoms persist past one week, follow up with your primary care provider, return to clinic as needed.

## 2016-06-29 DIAGNOSIS — M810 Age-related osteoporosis without current pathological fracture: Secondary | ICD-10-CM | POA: Diagnosis not present

## 2016-08-18 ENCOUNTER — Ambulatory Visit (HOSPITAL_COMMUNITY)
Admission: EM | Admit: 2016-08-18 | Discharge: 2016-08-18 | Disposition: A | Discharge status: Home / Self Care | Payer: PPO | Attending: Family Medicine | Admitting: Family Medicine

## 2016-08-18 ENCOUNTER — Encounter (HOSPITAL_COMMUNITY): Payer: Self-pay | Admitting: Emergency Medicine

## 2016-08-18 DIAGNOSIS — F419 Anxiety disorder, unspecified: Secondary | ICD-10-CM | POA: Insufficient documentation

## 2016-08-18 DIAGNOSIS — E785 Hyperlipidemia, unspecified: Secondary | ICD-10-CM | POA: Insufficient documentation

## 2016-08-18 DIAGNOSIS — I10 Essential (primary) hypertension: Secondary | ICD-10-CM | POA: Insufficient documentation

## 2016-08-18 DIAGNOSIS — Z87891 Personal history of nicotine dependence: Secondary | ICD-10-CM | POA: Diagnosis not present

## 2016-08-18 DIAGNOSIS — N3000 Acute cystitis without hematuria: Secondary | ICD-10-CM | POA: Diagnosis not present

## 2016-08-18 DIAGNOSIS — K219 Gastro-esophageal reflux disease without esophagitis: Secondary | ICD-10-CM | POA: Diagnosis not present

## 2016-08-18 DIAGNOSIS — Z8673 Personal history of transient ischemic attack (TIA), and cerebral infarction without residual deficits: Secondary | ICD-10-CM | POA: Diagnosis not present

## 2016-08-18 DIAGNOSIS — M549 Dorsalgia, unspecified: Secondary | ICD-10-CM | POA: Diagnosis not present

## 2016-08-18 DIAGNOSIS — R350 Frequency of micturition: Secondary | ICD-10-CM | POA: Diagnosis not present

## 2016-08-18 DIAGNOSIS — I4510 Unspecified right bundle-branch block: Secondary | ICD-10-CM | POA: Insufficient documentation

## 2016-08-18 DIAGNOSIS — Z7982 Long term (current) use of aspirin: Secondary | ICD-10-CM | POA: Diagnosis not present

## 2016-08-18 LAB — POCT URINALYSIS DIP (DEVICE)
BILIRUBIN URINE: NEGATIVE
GLUCOSE, UA: NEGATIVE mg/dL
Hgb urine dipstick: NEGATIVE
KETONES UR: NEGATIVE mg/dL
NITRITE: NEGATIVE
Protein, ur: NEGATIVE mg/dL
Specific Gravity, Urine: 1.015 (ref 1.005–1.030)
Urobilinogen, UA: 0.2 mg/dL (ref 0.0–1.0)
pH: 6 (ref 5.0–8.0)

## 2016-08-18 MED ORDER — CEPHALEXIN 500 MG PO CAPS: 500.0000 mg | ORAL_CAPSULE | Freq: Two times a day (BID) | ORAL | 0 refills | Status: AC

## 2016-08-18 NOTE — ED Notes (Signed)
pt discharged by provider

## 2016-08-18 NOTE — ED Triage Notes (Signed)
[facility] a [facility] of lower [facility] pain and urinary frequency and urgency x 2 days.

## 2016-08-18 NOTE — ED Provider Notes (Signed)
CSN: [id number]     Arrival date & time 08/18/16  1942 History   None    Chief Complaint  Patient presents with  . Back Pain   (Consider location/radiation/quality/duration/timing/severity/associated sxs/prior Treatment) 83 yo female comes in with son and daughter in law for 3 day history of mid back pain and urinary frequency. Patient loss control of her bladder today, which is not usual for her. Denies fever, chills, night sweats. Denies abdominal pain, N/V/D. Denies hematuria, dysuria. Back pain comes and goes, exacerbated by movement. Denies confusion, weakness, syncope.         Past Medical History:  Diagnosis Date  . ANEMIA-IRON DEFICIENCY 07/26/2008  . ANXIETY 07/26/2008  . ANXIETY DISORDER, GENERALIZED 10/20/2006  . CAROTID ARTERY DISEASE 11/06/2008   Doppler, September, 2010, 40-59% R. ICA, 60- 79% LICA  . CHEST PAIN 11/06/2008  . CVA (cerebral infarction)    February, 2012 hospitalized [facility].  Marland Kitchen. GERD 06/29/2006   Esophageal dilatation in the past  . HEMORRHOIDS, EXTERNAL 01/17/2008  . HYPERLIPIDEMIA 06/29/2006  . HYPERTENSION 06/29/2006  . INSOMNIA, HX OF 06/29/2006  . INTERSTITIAL CYSTITIS 06/29/2006  . OSTEOPOROSIS 06/29/2006  . RBBB 11/06/2008  . RECTAL BLEEDING 01/17/2008  . XEROSTOMIA 01/24/2007   Past Surgical History:  Procedure Laterality Date  . ABDOMINAL HYSTERECTOMY     Family History  Problem Relation Age of Onset  . Cancer Mother        sinus  . Heart disease Father    Social History  Substance Use Topics  . Smoking status: Former Games developermoker  . Smokeless tobacco: Not on file  . Alcohol use No   OB History    No data available     Review of Systems  Constitutional: Negative for activity change, chills, diaphoresis, fatigue and fever.  Eyes: Negative for visual disturbance.  Respiratory: Negative for cough, shortness of breath and wheezing.   Cardiovascular: Negative for chest pain and palpitations.  Gastrointestinal: Negative for abdominal pain,  constipation, diarrhea, nausea and vomiting.  Genitourinary: Positive for flank pain and frequency. Negative for decreased urine volume, difficulty urinating, dysuria, hematuria, pelvic pain, urgency, vaginal bleeding, vaginal discharge and vaginal pain.  Musculoskeletal: Positive for back pain.  Neurological: Negative for dizziness, tremors, syncope, facial asymmetry, speech difficulty, weakness, light-headedness, numbness and headaches.  Psychiatric/Behavioral: Negative for agitation and confusion.    Allergies  Diclofenac sodium and Naproxen  Home Medications   Prior to Admission medications   Medication Sig Start Date End Date Taking? Authorizing Provider  ALPRAZolam Prudy Feeler(XANAX) 1 MG tablet Take 1 mg by mouth daily.      [provider]  aspirin 325 MG tablet Take 325 mg by mouth daily.      [provider]  Calcium Carbonate-Vitamin D (CALTRATE 600+D) 600-400 MG-UNIT per tablet Take 1 tablet by mouth daily.      [provider]  cephALEXin (KEFLEX) 500 MG capsule Take 1 capsule (500 mg total) by mouth 2 (two) times daily. 08/18/16 08/25/16  Cathie HoopsYu, Arsen Mangione V, PA-C  cloNIDine (CATAPRES) 0.1 MG tablet Take 1 tablet (0.1 mg total) by mouth 2 (two) times daily. 04/10/10   Nelwyn SalisburyFry, Stephen A, MD  fluticasone (FLONASE) 50 MCG/ACT nasal spray 1 spray by Nasal route daily.      [provider]  hydrocortisone (ANUSOL-HC) 25 MG suppository Place 1 suppository (25 mg total) rectally 3 (three) times daily as needed. 07/28/10   Luis AbedKatz, Jeffrey D, MD  lisinopril (PRINIVIL,ZESTRIL) 20 MG tablet Take 20 mg by mouth  daily.      [provider]  ondansetron (ZOFRAN) 4 MG tablet Take 1 tablet (4 mg total) by mouth every 8 (eight) hours as needed for nausea or vomiting. 04/19/15   Ozella Rocks, MD  pantoprazole (PROTONIX) 40 MG tablet Take 40 mg by mouth daily.      [provider]  simvastatin (ZOCOR) 40 MG tablet Take 40 mg by mouth at bedtime.      [provider]  vitamin E 400 UNIT capsule Take 400 Units by mouth daily.      [provider]   Meds Ordered and Administered this Visit  Medications - No data to display  BP (!) 193/110 (BP Location: Left Arm)   Pulse 96   Temp 98.3 F (36.8 C) (Oral)   Resp 18   SpO2 98%  No data found.   Physical Exam  Constitutional: She is oriented to person, place, and time. She appears well-developed and well-nourished. No distress.  HENT:  Head: Normocephalic and atraumatic.  Eyes: Conjunctivae are normal. Pupils are equal, round, and reactive to light.  Cardiovascular: Normal rate and regular rhythm.  Exam reveals no gallop and no friction rub.   No murmur heard. Pulmonary/Chest: Effort normal and breath sounds normal.  Abdominal: Soft. Bowel sounds are normal. She exhibits no distension and no mass. There is no tenderness. There is no rebound, no guarding and no CVA tenderness.  Musculoskeletal:  No tenderness on palpation of the spinous processes. No tenderness on palpation of the back muscles. Did not attempt ROM due to patient's age and difficulty in movement.   Neurological: She is alert and oriented to person, place, and time.  Skin: Skin is warm and dry.  Psychiatric: She has a normal mood and affect. Her behavior is normal. Judgment normal.    Urgent Care Course     Procedures (including critical care time)  Labs Review Labs Reviewed  POCT URINALYSIS DIP (DEVICE) - Abnormal; Notable for the following:       Result Value   Leukocytes, UA SMALL (*)    All other components within normal limits  URINE CULTURE    Imaging Review No results found.       MDM   1. Acute cystitis without hematuria    1. Discussed lab results with patient and family members. Start Keflex 500mg  BID x 7 days. Side effects of medicines discussed with patient and family members. To monitor for fever, worsening back pain, hematuria, abdominal pain. 2. Patient BP 193/110 during visit. Family  members state patient BP is always higher at the doctor's office. Usual blood pressure around 140's. Patient without blurry vision, headache, slurred speech, weakness or other signs of end organ damage. Family members agrees to recheck blood pressure at home, and will monitor blood pressure closely. To go to the ED if patient experiences one sided weakness, slurred speech, numbness.     Belinda Fisher, PA-C 08/18/16 2041

## 2016-08-20 LAB — URINE CULTURE: SPECIAL REQUESTS: NORMAL

## 2016-12-16 DIAGNOSIS — E1122 Type 2 diabetes mellitus with diabetic chronic kidney disease: Secondary | ICD-10-CM | POA: Diagnosis not present

## 2016-12-16 DIAGNOSIS — Z0000 Encounter for general adult medical examination without abnormal findings: Secondary | ICD-10-CM | POA: Diagnosis not present

## 2016-12-16 DIAGNOSIS — Z23 Encounter for immunization: Secondary | ICD-10-CM | POA: Diagnosis not present

## 2016-12-16 DIAGNOSIS — R413 Other amnesia: Secondary | ICD-10-CM | POA: Diagnosis not present

## 2016-12-16 DIAGNOSIS — Z1389 Encounter for screening for other disorder: Secondary | ICD-10-CM | POA: Diagnosis not present

## 2016-12-16 DIAGNOSIS — M810 Age-related osteoporosis without current pathological fracture: Secondary | ICD-10-CM | POA: Diagnosis not present

## 2016-12-16 DIAGNOSIS — I10 Essential (primary) hypertension: Secondary | ICD-10-CM | POA: Diagnosis not present

## 2016-12-16 DIAGNOSIS — I6350 Cerebral infarction due to unspecified occlusion or stenosis of unspecified cerebral artery: Secondary | ICD-10-CM | POA: Diagnosis not present

## 2016-12-16 DIAGNOSIS — E785 Hyperlipidemia, unspecified: Secondary | ICD-10-CM | POA: Diagnosis not present

## 2016-12-16 DIAGNOSIS — K219 Gastro-esophageal reflux disease without esophagitis: Secondary | ICD-10-CM | POA: Diagnosis not present

## 2017-06-23 DIAGNOSIS — E785 Hyperlipidemia, unspecified: Secondary | ICD-10-CM | POA: Diagnosis not present

## 2017-06-25 DIAGNOSIS — E785 Hyperlipidemia, unspecified: Secondary | ICD-10-CM | POA: Diagnosis not present

## 2017-06-25 DIAGNOSIS — R634 Abnormal weight loss: Secondary | ICD-10-CM | POA: Diagnosis not present

## 2017-06-25 DIAGNOSIS — E1122 Type 2 diabetes mellitus with diabetic chronic kidney disease: Secondary | ICD-10-CM | POA: Diagnosis not present

## 2017-06-25 DIAGNOSIS — I10 Essential (primary) hypertension: Secondary | ICD-10-CM | POA: Diagnosis not present

## 2017-12-14 ENCOUNTER — Ambulatory Visit (INDEPENDENT_AMBULATORY_CARE_PROVIDER_SITE_OTHER): Payer: PPO

## 2017-12-14 ENCOUNTER — Ambulatory Visit (HOSPITAL_COMMUNITY)
Admission: EM | Admit: 2017-12-14 | Discharge: 2017-12-14 | Disposition: A | Discharge status: Home / Self Care | Payer: PPO | Attending: Family Medicine | Admitting: Family Medicine

## 2017-12-14 ENCOUNTER — Encounter (HOSPITAL_COMMUNITY): Payer: Self-pay | Admitting: *Deleted

## 2017-12-14 ENCOUNTER — Other Ambulatory Visit: Payer: Self-pay

## 2017-12-14 DIAGNOSIS — Z8673 Personal history of transient ischemic attack (TIA), and cerebral infarction without residual deficits: Secondary | ICD-10-CM | POA: Diagnosis not present

## 2017-12-14 DIAGNOSIS — D649 Anemia, unspecified: Secondary | ICD-10-CM | POA: Insufficient documentation

## 2017-12-14 DIAGNOSIS — Z9889 Other specified postprocedural states: Secondary | ICD-10-CM | POA: Insufficient documentation

## 2017-12-14 DIAGNOSIS — R300 Dysuria: Secondary | ICD-10-CM | POA: Diagnosis not present

## 2017-12-14 DIAGNOSIS — Z87891 Personal history of nicotine dependence: Secondary | ICD-10-CM | POA: Insufficient documentation

## 2017-12-14 DIAGNOSIS — Z7982 Long term (current) use of aspirin: Secondary | ICD-10-CM | POA: Insufficient documentation

## 2017-12-14 DIAGNOSIS — N390 Urinary tract infection, site not specified: Secondary | ICD-10-CM | POA: Diagnosis present

## 2017-12-14 DIAGNOSIS — Z792 Long term (current) use of antibiotics: Secondary | ICD-10-CM | POA: Insufficient documentation

## 2017-12-14 DIAGNOSIS — R05 Cough: Secondary | ICD-10-CM | POA: Diagnosis not present

## 2017-12-14 DIAGNOSIS — I10 Essential (primary) hypertension: Secondary | ICD-10-CM | POA: Insufficient documentation

## 2017-12-14 DIAGNOSIS — R059 Cough, unspecified: Secondary | ICD-10-CM

## 2017-12-14 DIAGNOSIS — F419 Anxiety disorder, unspecified: Secondary | ICD-10-CM | POA: Diagnosis not present

## 2017-12-14 DIAGNOSIS — E785 Hyperlipidemia, unspecified: Secondary | ICD-10-CM | POA: Diagnosis not present

## 2017-12-14 DIAGNOSIS — Z79899 Other long term (current) drug therapy: Secondary | ICD-10-CM | POA: Diagnosis not present

## 2017-12-14 DIAGNOSIS — Z886 Allergy status to analgesic agent status: Secondary | ICD-10-CM | POA: Insufficient documentation

## 2017-12-14 DIAGNOSIS — Z8249 Family history of ischemic heart disease and other diseases of the circulatory system: Secondary | ICD-10-CM | POA: Insufficient documentation

## 2017-12-14 DIAGNOSIS — K219 Gastro-esophageal reflux disease without esophagitis: Secondary | ICD-10-CM | POA: Insufficient documentation

## 2017-12-14 HISTORY — DX: Cerebral infarction, unspecified: I63.9

## 2017-12-14 MED ORDER — SULFAMETHOXAZOLE-TRIMETHOPRIM 800-160 MG PO TABS: 1.0000 | ORAL_TABLET | Freq: Two times a day (BID) | ORAL | 0 refills | Status: DC

## 2017-12-14 MED ORDER — CEPHALEXIN 500 MG PO CAPS: 500.0000 mg | ORAL_CAPSULE | Freq: Two times a day (BID) | ORAL | 0 refills | Status: AC

## 2017-12-14 MED ORDER — NYSTATIN 100000 UNIT/GM EX CREA: TOPICAL_CREAM | CUTANEOUS | 0 refills | Status: AC

## 2017-12-14 NOTE — Discharge Instructions (Signed)
It was nice meeting you!!  X ray didn't show any pneumonia Your urine showed slight infection We will go ahead and treat with antibiotics.  Follow up as needed for continued or worsening symptoms

## 2017-12-14 NOTE — ED Provider Notes (Signed)
[facility]    CSN: [id number] Arrival date & time: 12/14/17  1127     History   Chief Complaint Chief Complaint  Patient presents with  . Urinary Tract Infection  . Cough    HPI Lacey Stone is a 84 y.o. female.   Pt is a 84 year old female with PMH of anxiety, hypertension, CVA, GERD, hyperlipidemia, dementia.  She presents with 3 days of dysuria, urinary frequency.  She is here with her family and per them she has been more lethargic and has some loss of appetite.  She denies any associated abdominal pain, back pain, fever, chills, body aches.  She is also had a dry cough x3 days.  All of her symptoms have been constant and worsening.  She has not taking any kind of medication for her symptoms.  She denies any chest pain, SOB.   ROS per HPI      Past Medical History:  Diagnosis Date  . ANEMIA-IRON DEFICIENCY 07/26/2008  . ANXIETY 07/26/2008  . ANXIETY DISORDER, GENERALIZED 10/20/2006  . CAROTID ARTERY DISEASE 11/06/2008   Doppler, September, 2010, 40-59% R. ICA, 60- 79% LICA  . CHEST PAIN 11/06/2008  . CVA (cerebral infarction)    February, 2012 hospitalized [facility].  Marland Kitchen GERD 06/29/2006   Esophageal dilatation in the past  . HEMORRHOIDS, EXTERNAL 01/17/2008  . HYPERLIPIDEMIA 06/29/2006  . HYPERTENSION 06/29/2006  . INSOMNIA, HX OF 06/29/2006  . INTERSTITIAL CYSTITIS 06/29/2006  . OSTEOPOROSIS 06/29/2006  . RBBB 11/06/2008  . RECTAL BLEEDING 01/17/2008  . Stroke (HCC)   . XEROSTOMIA 01/24/2007    Patient Active Problem List   Diagnosis Date Noted  . CVA (cerebral infarction)   . FRACTURE, TIBIAL PLATEAU 04/16/2009  . RBBB 11/06/2008  . CHEST PAIN 11/06/2008  . CAROTID ARTERY DISEASE 11/06/2008  . ANEMIA-IRON DEFICIENCY 07/26/2008  . ANXIETY 07/26/2008  . HEMORRHOIDS, EXTERNAL 01/17/2008  . RECTAL BLEEDING 01/17/2008  . OTHER SPECIFIED DISORDERS OF LIVER 01/02/2008  . ANEMIA 07/08/2007  . ACUTE CYSTITIS 03/23/2007  . CERUMEN IMPACTION 01/24/2007  . XEROSTOMIA  01/24/2007  . BURN NOS, 2ND DEGREE 11/03/2006  . ANXIETY DISORDER, GENERALIZED 10/20/2006  . HYPERLIPIDEMIA 06/29/2006  . HYPERTENSION 06/29/2006  . INTERSTITIAL CYSTITIS 06/29/2006  . OSTEOPOROSIS 06/29/2006  . INSOMNIA, HX OF 06/29/2006  . GERD 06/29/2006    Past Surgical History:  Procedure Laterality Date  . ABDOMINAL HYSTERECTOMY      OB History   None      Home Medications    Prior to Admission medications   Medication Sig Start Date End Date Taking? Authorizing Provider  ALPRAZolam Prudy Feeler) 1 MG tablet Take 1 mg by mouth daily.      [provider]  aspirin 325 MG tablet Take 325 mg by mouth daily.      [provider]  Calcium Carbonate-Vitamin D (CALTRATE 600+D) 600-400 MG-UNIT per tablet Take 1 tablet by mouth daily.      [provider]  cephALEXin (KEFLEX) 500 MG capsule Take 1 capsule (500 mg total) by mouth 2 (two) times daily. 12/14/17   Dahlia Byes A, NP  cloNIDine (CATAPRES) 0.1 MG tablet Take 1 tablet (0.1 mg total) by mouth 2 (two) times daily. 04/10/10   Nelwyn Salisbury, MD  fluticasone (FLONASE) 50 MCG/ACT nasal spray 1 spray by Nasal route daily.      [provider]  hydrocortisone (ANUSOL-HC) 25 MG suppository Place 1 suppository (25 mg total) rectally 3 (three) times daily as needed.  07/28/10   Luis Abed, MD  lisinopril (PRINIVIL,ZESTRIL) 20 MG tablet Take 20 mg by mouth daily.      [provider]  nystatin cream (MYCOSTATIN) Apply to affected area 2 times daily 12/14/17   Dahlia Byes A, NP  ondansetron (ZOFRAN) 4 MG tablet Take 1 tablet (4 mg total) by mouth every 8 (eight) hours as needed for nausea or vomiting. 04/19/15   Ozella Rocks, MD  pantoprazole (PROTONIX) 40 MG tablet Take 40 mg by mouth daily.      [provider]  simvastatin (ZOCOR) 40 MG tablet Take 40 mg by mouth at bedtime.      [provider]  vitamin E 400 UNIT capsule Take 400 Units by mouth daily.      [provider]    Family History Family History  Problem Relation Age of Onset  . Cancer Mother        sinus  . Heart disease Father     Social History Social History   Tobacco Use  . Smoking status: Former Games developer  . Smokeless tobacco: Never Used  Substance Use Topics  . Alcohol use: No  . Drug use: No     Allergies   Diclofenac sodium and Naproxen   Review of Systems Review of Systems   Physical Exam Triage Vital Signs ED Triage Vitals [12/14/17 1204]  Enc Vitals Group     BP (!) 191/77     Pulse Rate 70     Resp 18     Temp (!) 97.5 F (36.4 C)     Temp Source Oral     SpO2 97 %     Weight      Height      Head Circumference      Peak Flow      Pain Score      Pain Loc      Pain Edu?      Excl. in GC?    No data found.  Updated Vital Signs BP (!) 191/77 (BP Location: Right Arm)   Pulse 70   Temp (!) 97.5 F (36.4 C) (Oral)   Resp 18   SpO2 97%   Visual Acuity Right Eye Distance:   Left Eye Distance:   Bilateral Distance:    Right Eye Near:   Left Eye Near:    Bilateral Near:     Physical Exam  Constitutional: She appears well-developed and well-nourished.  Very pleasant. Non toxic or ill appearing.   HENT:  Head: Normocephalic and atraumatic.  Right Ear: External ear normal.  Left Ear: External ear normal.  Mouth/Throat: Oropharynx is clear and moist.  Eyes: Pupils are equal, round, and reactive to light. Conjunctivae and EOM are normal.  Neck: Normal range of motion.  Cardiovascular: Normal rate, regular rhythm and normal heart sounds.  Pulmonary/Chest: Effort normal.  Coarse lung sounds throughout lung fields  Abdominal: Soft.  Abdomen soft, non tender. No CVA tenderness. No rebound tenderness.   Musculoskeletal: Normal range of motion.  Lymphadenopathy:    She has no cervical adenopathy.  Neurological: She is alert.  Skin: Skin is warm and dry. No rash noted. No erythema. No pallor.  Psychiatric: She has a normal mood and  affect.  Nursing note and vitals reviewed.    UC Treatments / Results  Labs (all labs ordered are listed, but only abnormal results are displayed) Labs Reviewed  URINE CULTURE    EKG None  Radiology Dg Chest 2  View  Result Date: 12/14/2017 CLINICAL DATA:  Cough for 3 days, congestion, hypertension, hyperlipidemia EXAM: CHEST - 2 VIEW COMPARISON:  04/19/2015 FINDINGS: Normal heart size, mediastinal contours, and pulmonary vascularity. Atherosclerotic calcification aorta. Bronchitic changes with minimal bibasilar atelectasis. No acute infiltrate, pleural effusion or pneumothorax. Bones demineralized. IMPRESSION: Bronchitic changes with bibasilar atelectasis. No acute abnormalities. Electronically Signed   By: Ulyses Southward M.D.   On: 12/14/2017 13:13    Procedures Procedures (including critical care time)  Medications Ordered in UC Medications - No data to display  Initial Impression / Assessment and Plan / UC Course  I have reviewed the triage vital signs and the nursing notes.  Pertinent labs & imaging results that were available during my care of the patient were reviewed by me and considered in my medical decision making (see chart for details).     Urine showed trace leuks and blood. We will go ahead and treat for urine infection pending the culture. Still possibility of yeast infection. We did not have enough urine for cytology. Will go ahead and prescribe nystatin to use in case.  Instructed family to make sure that she stays hydrated.  X ray negative for pneumonia.  Instructed to start mucinex for cough and congestion.  Follow up as needed for continued or worsening symptoms   Final Clinical Impressions(s) / UC Diagnoses   Final diagnoses:  Cough  Dysuria     Discharge Instructions     It was nice meeting you!!  X ray didn't show any pneumonia Your urine showed slight infection We will go ahead and treat with antibiotics.  Follow up as needed for continued  or worsening symptoms       ED Prescriptions    Medication Sig Dispense Auth. Provider   sulfamethoxazole-trimethoprim (BACTRIM DS,SEPTRA DS) 800-160 MG tablet  (Status: Discontinued) Take 1 tablet by mouth 2 (two) times daily for 7 days. 14 tablet Meggie Laseter A, NP   cephALEXin (KEFLEX) 500 MG capsule Take 1 capsule (500 mg total) by mouth 2 (two) times daily. 28 capsule Lorin Gawron A, NP   nystatin cream (MYCOSTATIN) Apply to affected area 2 times daily 30 g Dahlia Byes A, NP     Controlled Substance Prescriptions [place] Controlled Substance Registry consulted? Not Applicable   Janace Aris, NP 12/14/17 1604

## 2017-12-14 NOTE — ED Triage Notes (Signed)
C/o pain and burning with urination off and on x 1 week, c/o cough and decreased appetite

## 2017-12-15 ENCOUNTER — Telehealth: Payer: Self-pay | Admitting: Emergency Medicine

## 2017-12-15 LAB — URINE CULTURE

## 2017-12-15 NOTE — Telephone Encounter (Signed)
Patients number has been disconnected. Called patients son (HPOA/POA) Son states mother is doing better and will continue with POC.

## 2017-12-23 DIAGNOSIS — Z0000 Encounter for general adult medical examination without abnormal findings: Secondary | ICD-10-CM | POA: Diagnosis not present

## 2017-12-23 DIAGNOSIS — E785 Hyperlipidemia, unspecified: Secondary | ICD-10-CM | POA: Diagnosis not present

## 2017-12-23 DIAGNOSIS — Z1389 Encounter for screening for other disorder: Secondary | ICD-10-CM | POA: Diagnosis not present

## 2017-12-23 DIAGNOSIS — K219 Gastro-esophageal reflux disease without esophagitis: Secondary | ICD-10-CM | POA: Diagnosis not present

## 2017-12-23 DIAGNOSIS — Z23 Encounter for immunization: Secondary | ICD-10-CM | POA: Diagnosis not present

## 2017-12-23 DIAGNOSIS — N183 Chronic kidney disease, stage 3 (moderate): Secondary | ICD-10-CM | POA: Diagnosis not present

## 2017-12-23 DIAGNOSIS — M810 Age-related osteoporosis without current pathological fracture: Secondary | ICD-10-CM | POA: Diagnosis not present

## 2017-12-23 DIAGNOSIS — N139 Obstructive and reflux uropathy, unspecified: Secondary | ICD-10-CM | POA: Diagnosis not present

## 2017-12-23 DIAGNOSIS — E1122 Type 2 diabetes mellitus with diabetic chronic kidney disease: Secondary | ICD-10-CM | POA: Diagnosis not present

## 2018-02-14 DIAGNOSIS — E1122 Type 2 diabetes mellitus with diabetic chronic kidney disease: Secondary | ICD-10-CM | POA: Diagnosis not present

## 2018-02-14 DIAGNOSIS — I10 Essential (primary) hypertension: Secondary | ICD-10-CM | POA: Diagnosis not present

## 2018-02-14 DIAGNOSIS — I6350 Cerebral infarction due to unspecified occlusion or stenosis of unspecified cerebral artery: Secondary | ICD-10-CM | POA: Diagnosis not present

## 2018-02-14 DIAGNOSIS — N183 Chronic kidney disease, stage 3 (moderate): Secondary | ICD-10-CM | POA: Diagnosis not present

## 2018-02-14 DIAGNOSIS — E785 Hyperlipidemia, unspecified: Secondary | ICD-10-CM | POA: Diagnosis not present

## 2018-02-14 DIAGNOSIS — M810 Age-related osteoporosis without current pathological fracture: Secondary | ICD-10-CM | POA: Diagnosis not present

## 2018-04-19 DIAGNOSIS — E1122 Type 2 diabetes mellitus with diabetic chronic kidney disease: Secondary | ICD-10-CM | POA: Diagnosis not present

## 2018-04-19 DIAGNOSIS — I6350 Cerebral infarction due to unspecified occlusion or stenosis of unspecified cerebral artery: Secondary | ICD-10-CM | POA: Diagnosis not present

## 2018-04-19 DIAGNOSIS — M810 Age-related osteoporosis without current pathological fracture: Secondary | ICD-10-CM | POA: Diagnosis not present

## 2018-04-19 DIAGNOSIS — I10 Essential (primary) hypertension: Secondary | ICD-10-CM | POA: Diagnosis not present

## 2018-04-19 DIAGNOSIS — N183 Chronic kidney disease, stage 3 (moderate): Secondary | ICD-10-CM | POA: Diagnosis not present

## 2018-04-19 DIAGNOSIS — E785 Hyperlipidemia, unspecified: Secondary | ICD-10-CM | POA: Diagnosis not present

## 2018-07-15 DIAGNOSIS — I10 Essential (primary) hypertension: Secondary | ICD-10-CM | POA: Diagnosis not present

## 2018-07-15 DIAGNOSIS — M810 Age-related osteoporosis without current pathological fracture: Secondary | ICD-10-CM | POA: Diagnosis not present

## 2018-07-15 DIAGNOSIS — E785 Hyperlipidemia, unspecified: Secondary | ICD-10-CM | POA: Diagnosis not present

## 2018-07-15 DIAGNOSIS — E1122 Type 2 diabetes mellitus with diabetic chronic kidney disease: Secondary | ICD-10-CM | POA: Diagnosis not present

## 2018-07-15 DIAGNOSIS — N183 Chronic kidney disease, stage 3 (moderate): Secondary | ICD-10-CM | POA: Diagnosis not present

## 2018-07-15 DIAGNOSIS — I6350 Cerebral infarction due to unspecified occlusion or stenosis of unspecified cerebral artery: Secondary | ICD-10-CM | POA: Diagnosis not present

## 2018-10-06 DIAGNOSIS — M810 Age-related osteoporosis without current pathological fracture: Secondary | ICD-10-CM | POA: Diagnosis not present

## 2018-10-06 DIAGNOSIS — N183 Chronic kidney disease, stage 3 (moderate): Secondary | ICD-10-CM | POA: Diagnosis not present

## 2018-10-06 DIAGNOSIS — E785 Hyperlipidemia, unspecified: Secondary | ICD-10-CM | POA: Diagnosis not present

## 2018-10-06 DIAGNOSIS — I10 Essential (primary) hypertension: Secondary | ICD-10-CM | POA: Diagnosis not present

## 2018-10-06 DIAGNOSIS — I6350 Cerebral infarction due to unspecified occlusion or stenosis of unspecified cerebral artery: Secondary | ICD-10-CM | POA: Diagnosis not present

## 2018-10-06 DIAGNOSIS — E1122 Type 2 diabetes mellitus with diabetic chronic kidney disease: Secondary | ICD-10-CM | POA: Diagnosis not present

## 2019-01-21 ENCOUNTER — Emergency Department (HOSPITAL_COMMUNITY)
Admission: EM | Admit: 2019-01-21 | Discharge: 2019-01-21 | Disposition: A | Discharge status: Home / Self Care | Payer: PPO | Attending: Emergency Medicine | Admitting: Emergency Medicine

## 2019-01-21 ENCOUNTER — Emergency Department (HOSPITAL_COMMUNITY): Payer: PPO

## 2019-01-21 ENCOUNTER — Other Ambulatory Visit: Payer: Self-pay

## 2019-01-21 ENCOUNTER — Encounter (HOSPITAL_COMMUNITY): Payer: Self-pay | Admitting: *Deleted

## 2019-01-21 DIAGNOSIS — R402411 Glasgow coma scale score 13-15, in the field [EMT or ambulance]: Secondary | ICD-10-CM | POA: Diagnosis not present

## 2019-01-21 DIAGNOSIS — R52 Pain, unspecified: Secondary | ICD-10-CM | POA: Diagnosis not present

## 2019-01-21 DIAGNOSIS — S79911A Unspecified injury of right hip, initial encounter: Secondary | ICD-10-CM | POA: Diagnosis not present

## 2019-01-21 DIAGNOSIS — Y999 Unspecified external cause status: Secondary | ICD-10-CM | POA: Diagnosis not present

## 2019-01-21 DIAGNOSIS — W19XXXA Unspecified fall, initial encounter: Secondary | ICD-10-CM | POA: Diagnosis not present

## 2019-01-21 DIAGNOSIS — S7001XA Contusion of right hip, initial encounter: Secondary | ICD-10-CM | POA: Diagnosis not present

## 2019-01-21 DIAGNOSIS — Z7982 Long term (current) use of aspirin: Secondary | ICD-10-CM | POA: Insufficient documentation

## 2019-01-21 DIAGNOSIS — Y939 Activity, unspecified: Secondary | ICD-10-CM | POA: Diagnosis not present

## 2019-01-21 DIAGNOSIS — Y929 Unspecified place or not applicable: Secondary | ICD-10-CM | POA: Insufficient documentation

## 2019-01-21 DIAGNOSIS — M25551 Pain in right hip: Secondary | ICD-10-CM | POA: Diagnosis not present

## 2019-01-21 DIAGNOSIS — R404 Transient alteration of awareness: Secondary | ICD-10-CM | POA: Diagnosis not present

## 2019-01-21 DIAGNOSIS — I10 Essential (primary) hypertension: Secondary | ICD-10-CM | POA: Diagnosis not present

## 2019-01-21 DIAGNOSIS — I2510 Atherosclerotic heart disease of native coronary artery without angina pectoris: Secondary | ICD-10-CM | POA: Insufficient documentation

## 2019-01-21 DIAGNOSIS — Z87891 Personal history of nicotine dependence: Secondary | ICD-10-CM | POA: Insufficient documentation

## 2019-01-21 DIAGNOSIS — W010XXA Fall on same level from slipping, tripping and stumbling without subsequent striking against object, initial encounter: Secondary | ICD-10-CM | POA: Insufficient documentation

## 2019-01-21 DIAGNOSIS — M2550 Pain in unspecified joint: Secondary | ICD-10-CM | POA: Diagnosis not present

## 2019-01-21 DIAGNOSIS — S70911A Unspecified superficial injury of right hip, initial encounter: Secondary | ICD-10-CM | POA: Diagnosis present

## 2019-01-21 DIAGNOSIS — Z79899 Other long term (current) drug therapy: Secondary | ICD-10-CM | POA: Diagnosis not present

## 2019-01-21 DIAGNOSIS — R410 Disorientation, unspecified: Secondary | ICD-10-CM | POA: Diagnosis not present

## 2019-01-21 DIAGNOSIS — Z7401 Bed confinement status: Secondary | ICD-10-CM | POA: Diagnosis not present

## 2019-01-21 LAB — BASIC METABOLIC PANEL
Anion gap: 9 (ref 5–15)
BUN: 17 mg/dL (ref 8–23)
CO2: 24 mmol/L (ref 22–32)
Calcium: 9 mg/dL (ref 8.9–10.3)
Chloride: 105 mmol/L (ref 98–111)
Creatinine, Ser: 0.9 mg/dL (ref 0.44–1.00)
GFR calc Af Amer: >60 mL/min (ref >60)
GFR calc non Af Amer: 54 mL/min — ABNORMAL LOW (ref >60)
Glucose, Bld: 128 mg/dL — ABNORMAL HIGH (ref 70–99)
Potassium: 4.3 mmol/L (ref 3.5–5.1)
Sodium: 138 mmol/L (ref 135–145)

## 2019-01-21 LAB — CBC
HCT: 37.5 % (ref 36.0–46.0)
Hemoglobin: 12.7 g/dL (ref 12.0–15.0)
MCH: 30.3 pg (ref 26.0–34.0)
MCHC: 33.9 g/dL (ref 30.0–36.0)
MCV: 89.5 fL (ref 80.0–100.0)
Platelets: 232 10*3/uL (ref 150–400)
RBC: 4.19 MIL/uL (ref 3.87–5.11)
RDW: 12.9 % (ref 11.5–15.5)
WBC: 10.6 10*3/uL — ABNORMAL HIGH (ref 4.0–10.5)
nRBC: 0 % (ref 0.0–0.2)

## 2019-01-21 MED ORDER — SODIUM CHLORIDE 0.9% FLUSH: 3.0000 mL | Freq: Once | INTRAVENOUS | Status: DC

## 2019-01-21 NOTE — ED Notes (Signed)
Patient stated that she will let us know when she can give [place] a urine sample

## 2019-01-21 NOTE — ED Triage Notes (Addendum)
Pt here from [facility] via [facility] for unwitnessed fall.  Possibly laid on ground 1 hour.  Pt with dementia and is a "known wanderer".  Laying in bed, groaning constantly, which is the norm, per [facility].  No rotation or shortening, per [facility]. R hip pain.

## 2019-01-21 NOTE — ED Notes (Signed)
Got patient up and she walked a couple of steps and went back to sitting position patient did well now patient is back in bed on the monitor with call bell in reach

## 2019-01-21 NOTE — ED Notes (Signed)
[facility] called @ 1359-per Lattie Haw, RN called by Levada Dy

## 2019-01-21 NOTE — Discharge Instructions (Addendum)
Take Tylenol every 4 hours as needed for pain.  See your doctor if needed for problems.

## 2019-01-21 NOTE — ED Notes (Signed)
Got patient on the monitor patient is now gone to xray

## 2019-01-21 NOTE — ED Provider Notes (Signed)
[facility] EMERGENCY DEPARTMENT Provider Note   CSN: [id number] Arrival date & time: 01/21/19  1053     History   Chief Complaint Chief Complaint  Patient presents with  . Fall  . Hip Pain    HPI PRICILLA Stone is a 85 y.o. female.     HPI   Patient lives in a nursing facility and was found on floor.  Unknown mechanism of fall.  Patient complains only pain in the right hip.  She is a poor historian but is alert and responsive.  Level 5 caveat-dementia  Past Medical History:  Diagnosis Date  . ANEMIA-IRON DEFICIENCY 07/26/2008  . ANXIETY 07/26/2008  . ANXIETY DISORDER, GENERALIZED 10/20/2006  . CAROTID ARTERY DISEASE 11/06/2008   Doppler, September, 2010, 40-59% R. ICA, 60- 79% LICA  . CHEST PAIN 11/06/2008  . CVA (cerebral infarction)    February, 2012 hospitalized [facility].  Marland Kitchen GERD 06/29/2006   Esophageal dilatation in the past  . HEMORRHOIDS, EXTERNAL 01/17/2008  . HYPERLIPIDEMIA 06/29/2006  . HYPERTENSION 06/29/2006  . INSOMNIA, HX OF 06/29/2006  . INTERSTITIAL CYSTITIS 06/29/2006  . OSTEOPOROSIS 06/29/2006  . RBBB 11/06/2008  . RECTAL BLEEDING 01/17/2008  . Stroke (HCC)   . XEROSTOMIA 01/24/2007    Patient Active Problem List   Diagnosis Date Noted  . CVA (cerebral infarction)   . FRACTURE, TIBIAL PLATEAU 04/16/2009  . RBBB 11/06/2008  . CHEST PAIN 11/06/2008  . CAROTID ARTERY DISEASE 11/06/2008  . ANEMIA-IRON DEFICIENCY 07/26/2008  . ANXIETY 07/26/2008  . HEMORRHOIDS, EXTERNAL 01/17/2008  . RECTAL BLEEDING 01/17/2008  . OTHER SPECIFIED DISORDERS OF LIVER 01/02/2008  . ANEMIA 07/08/2007  . ACUTE CYSTITIS 03/23/2007  . CERUMEN IMPACTION 01/24/2007  . XEROSTOMIA 01/24/2007  . BURN NOS, 2ND DEGREE 11/03/2006  . ANXIETY DISORDER, GENERALIZED 10/20/2006  . HYPERLIPIDEMIA 06/29/2006  . HYPERTENSION 06/29/2006  . INTERSTITIAL CYSTITIS 06/29/2006  . OSTEOPOROSIS 06/29/2006  . INSOMNIA, HX OF 06/29/2006  . GERD 06/29/2006    Past Surgical History:   Procedure Laterality Date  . ABDOMINAL HYSTERECTOMY       OB History   No obstetric history on file.      Home Medications    Prior to Admission medications   Medication Sig Start Date End Date Taking? Authorizing Provider  ALPRAZolam Prudy Feeler) 1 MG tablet Take 1 mg by mouth daily.      [provider]  aspirin 325 MG tablet Take 325 mg by mouth daily.      [provider]  Calcium Carbonate-Vitamin D (CALTRATE 600+D) 600-400 MG-UNIT per tablet Take 1 tablet by mouth daily.      [provider]  cephALEXin (KEFLEX) 500 MG capsule Take 1 capsule (500 mg total) by mouth 2 (two) times daily. 12/14/17   Dahlia Byes A, NP  cloNIDine (CATAPRES) 0.1 MG tablet Take 1 tablet (0.1 mg total) by mouth 2 (two) times daily. 04/10/10   Nelwyn Salisbury, MD  fluticasone (FLONASE) 50 MCG/ACT nasal spray 1 spray by Nasal route daily.      [provider]  hydrocortisone (ANUSOL-HC) 25 MG suppository Place 1 suppository (25 mg total) rectally 3 (three) times daily as needed. 07/28/10   Luis Abed, MD  lisinopril (PRINIVIL,ZESTRIL) 20 MG tablet Take 20 mg by mouth daily.      [provider]  nystatin cream (MYCOSTATIN) Apply to affected area 2 times daily 12/14/17   Dahlia Byes A, NP  ondansetron (ZOFRAN) 4 MG tablet Take 1 tablet (4  mg total) by mouth every 8 (eight) hours as needed for nausea or vomiting. 04/19/15   Ozella Rocks, MD  pantoprazole (PROTONIX) 40 MG tablet Take 40 mg by mouth daily.      [provider]  simvastatin (ZOCOR) 40 MG tablet Take 40 mg by mouth at bedtime.      [provider]  vitamin E 400 UNIT capsule Take 400 Units by mouth daily.      [provider]    Family History Family History  Problem Relation Age of Onset  . Cancer Mother        sinus  . Heart disease Father     Social History Social History   Tobacco Use  . Smoking status: Former Games developer  . Smokeless tobacco: Never Used   Substance Use Topics  . Alcohol use: No  . Drug use: No     Allergies   Diclofenac sodium and Naproxen   Review of Systems Review of Systems  Unable to perform ROS: Dementia     Physical Exam Updated Vital Signs BP (!) 186/84   Pulse 82   Temp 97.8 F (36.6 C) (Oral)   Resp 20   Ht 5\' 3"  (1.6 m)   Wt 59.9 kg   SpO2 96%   BMI 23.39 kg/m   Physical Exam Vitals signs and nursing note reviewed.  Constitutional:      General: She is not in acute distress.    Appearance: She is well-developed. She is not ill-appearing, toxic-appearing or diaphoretic.  HENT:     Head: Normocephalic and atraumatic.     Right Ear: External ear normal.     Left Ear: External ear normal.  Eyes:     Conjunctiva/sclera: Conjunctivae normal.     Pupils: Pupils are equal, round, and reactive to light.  Neck:     Musculoskeletal: Normal range of motion and neck supple.     Trachea: Phonation normal.  Cardiovascular:     Rate and Rhythm: Normal rate and regular rhythm.     Heart sounds: Normal heart sounds.  Pulmonary:     Effort: Pulmonary effort is normal.     Breath sounds: Normal breath sounds.  Chest:     Chest wall: No tenderness (No crepitation or deformity.).  Abdominal:     Palpations: Abdomen is soft.     Tenderness: There is no abdominal tenderness.  Musculoskeletal: Normal range of motion.     Comments: Good active range of motion arms and left leg bilaterally.  She guards against movement of the right hip somewhat but is able to extend the right leg off the stretcher by about 18 inches.  No deformities of the large joints.  Skin:    General: Skin is warm and dry.  Neurological:     Mental Status: She is alert.     Cranial Nerves: No cranial nerve deficit.     Sensory: No sensory deficit.     Motor: No abnormal muscle tone.     Coordination: Coordination normal.  Psychiatric:        Mood and Affect: Mood normal.        Behavior: Behavior normal.      ED Treatments /  Results  Labs (all labs ordered are listed, but only abnormal results are displayed) Labs Reviewed  BASIC METABOLIC PANEL - Abnormal; Notable for the following components:      Result Value   Glucose, Bld 128 (*)    GFR calc non Af  Amer 54 (*)    All other components within normal limits  CBC - Abnormal; Notable for the following components:   WBC 10.6 (*)    All other components within normal limits  URINALYSIS, ROUTINE W REFLEX MICROSCOPIC  CBG MONITORING, ED    EKG EKG Interpretation  Date/Time:  Saturday January 21 2019 11:16:15 EST Ventricular Rate:  96 PR Interval:    QRS Duration: 125 QT Interval:  372 QTC Calculation: 471 R Axis:   89 Text Interpretation: Sinus rhythm Right bundle branch block since last tracing no significant change Confirmed by Daleen Bo [phone]) on 01/21/2019 1:16:16 PM   Radiology Dg Hip Unilat  With Pelvis 2-3 Views Right  Result Date: 01/21/2019 CLINICAL DATA:  Right hip pain post fall EXAM: DG HIP (WITH OR WITHOUT PELVIS) 2-3V RIGHT COMPARISON:  CT 06/30/2010 FINDINGS: Mild osteopenia. no definite fracture. No dislocation. Iliofemoral arterial calcifications. Bilateral pelvic phleboliths. IMPRESSION: Negative for fracture or other acute bone abnormality. Electronically Signed   By: Lucrezia Europe M.D.   On: 01/21/2019 12:27    Procedures .Critical Care Performed by: Daleen Bo, MD Authorized by: Daleen Bo, MD   Critical care provider statement:    Critical care time (minutes):  35   Critical care time was exclusive of:  Separately billable procedures and treating other patients   Critical care was time spent personally by me on the following activities:  Blood draw for specimens, development of treatment plan with patient or surrogate, discussions with consultants, evaluation of patient's response to treatment, examination of patient, obtaining history from patient or surrogate, ordering and performing treatments and interventions,  ordering and review of laboratory studies, pulse oximetry, re-evaluation of patient's condition, review of old charts and ordering and review of radiographic studies   (including critical care time)  Medications Ordered in ED Medications  sodium chloride flush (NS) 0.9 % injection 3 mL (3 mLs Intravenous Not Given 01/21/19 1153)     Initial Impression / Assessment and Plan / ED Course  I have reviewed the triage vital signs and the nursing notes.  Pertinent labs & imaging results that were available during my care of the patient were reviewed by me and considered in my medical decision making (see chart for details).         Patient Vitals for the past 24 hrs:  BP Temp Temp src Pulse Resp SpO2 Height Weight  01/21/19 1215 (!) 186/84 - - 82 20 96 % - -  01/21/19 1200 (!) 192/76 - - 84 (!) 21 97 % - -  01/21/19 1145 (!) 192/77 - - - 17 - - -  01/21/19 1106 (!) 158/99 97.8 F (36.6 C) Oral 87 18 99 % - -  01/21/19 1056 - - - - - - 5\' 3"  (1.6 m) 59.9 kg    1:20 PM Reevaluation with update and discussion. After initial assessment and treatment, an updated evaluation reveals patient reports that she is in a wheelchair mostly but does transfer for stooling and urinating.  We were able ambulate her with mild assistance, to the sink and back in her room.  She is bearing weight fully on her right leg.  I was able to talk to her son, Legrand Como, who is her healthcare power of attorney.  We discussed the findings and the plan, all questions were answered. Daleen Bo   Medical Decision Making: Fall, unclear mechanism, unwitnessed.  Vital signs are reassuring.  Clinical examination is reassuring.  No indication for further ED  evaluation or treatment at this time.   Jeralene Petersearl F Charon was evaluated in Emergency Department on 01/21/2019 for the symptoms described in the history of present illness. She was evaluated in the context of the global COVID-19 pandemic, which necessitated consideration that  the patient might be at risk for infection with the SARS-CoV-2 virus that causes COVID-19. Institutional protocols and algorithms that pertain to the evaluation of patients at risk for COVID-19 are in a state of rapid change based on information released by regulatory bodies including the CDC and federal and state organizations. These policies and algorithms were followed during the patient's care in the ED.  CRITICAL CARE-no Performed by: Mancel BaleElliott Alazne Quant  Nursing Notes Reviewed/ Care Coordinated Applicable Imaging Reviewed Interpretation of Laboratory Data incorporated into ED treatment  The patient appears reasonably screened and/or stabilized for discharge and I doubt any other medical condition or other [facility] requiring further screening, evaluation, or treatment in the ED at this time prior to discharge.  Plan: Home Medications-routine, Tylenol for pain; Home Treatments-rest; return here if the recommended treatment, does not improve the symptoms; Recommended follow up-PCP,.    Final Clinical Impressions(s) / ED Diagnoses   Final diagnoses:  Fall, initial encounter  Contusion of right hip, initial encounter    ED Discharge Orders    None       Mancel BaleWentz, Gracee Ratterree, MD 01/21/19 1324

## 2019-01-30 DIAGNOSIS — E785 Hyperlipidemia, unspecified: Secondary | ICD-10-CM | POA: Diagnosis not present

## 2019-01-30 DIAGNOSIS — I10 Essential (primary) hypertension: Secondary | ICD-10-CM | POA: Diagnosis not present

## 2019-01-30 DIAGNOSIS — I6350 Cerebral infarction due to unspecified occlusion or stenosis of unspecified cerebral artery: Secondary | ICD-10-CM | POA: Diagnosis not present

## 2019-01-30 DIAGNOSIS — E1122 Type 2 diabetes mellitus with diabetic chronic kidney disease: Secondary | ICD-10-CM | POA: Diagnosis not present

## 2019-01-30 DIAGNOSIS — N183 Chronic kidney disease, stage 3 unspecified: Secondary | ICD-10-CM | POA: Diagnosis not present

## 2019-01-30 DIAGNOSIS — M810 Age-related osteoporosis without current pathological fracture: Secondary | ICD-10-CM | POA: Diagnosis not present

## 2019-04-28 DIAGNOSIS — I10 Essential (primary) hypertension: Secondary | ICD-10-CM | POA: Diagnosis not present

## 2019-04-28 DIAGNOSIS — E785 Hyperlipidemia, unspecified: Secondary | ICD-10-CM | POA: Diagnosis not present

## 2019-04-28 DIAGNOSIS — I6350 Cerebral infarction due to unspecified occlusion or stenosis of unspecified cerebral artery: Secondary | ICD-10-CM | POA: Diagnosis not present

## 2019-04-28 DIAGNOSIS — N183 Chronic kidney disease, stage 3 unspecified: Secondary | ICD-10-CM | POA: Diagnosis not present

## 2019-04-28 DIAGNOSIS — E1122 Type 2 diabetes mellitus with diabetic chronic kidney disease: Secondary | ICD-10-CM | POA: Diagnosis not present

## 2019-04-28 DIAGNOSIS — M810 Age-related osteoporosis without current pathological fracture: Secondary | ICD-10-CM | POA: Diagnosis not present

## 2019-05-31 DIAGNOSIS — Z1159 Encounter for screening for other viral diseases: Secondary | ICD-10-CM | POA: Diagnosis not present

## 2019-05-31 DIAGNOSIS — Z20828 Contact with and (suspected) exposure to other viral communicable diseases: Secondary | ICD-10-CM | POA: Diagnosis not present

## 2019-06-07 DIAGNOSIS — Z20828 Contact with and (suspected) exposure to other viral communicable diseases: Secondary | ICD-10-CM | POA: Diagnosis not present

## 2019-06-07 DIAGNOSIS — Z1159 Encounter for screening for other viral diseases: Secondary | ICD-10-CM | POA: Diagnosis not present

## 2019-06-14 DIAGNOSIS — Z20828 Contact with and (suspected) exposure to other viral communicable diseases: Secondary | ICD-10-CM | POA: Diagnosis not present

## 2019-06-14 DIAGNOSIS — Z1159 Encounter for screening for other viral diseases: Secondary | ICD-10-CM | POA: Diagnosis not present

## 2019-06-21 DIAGNOSIS — Z20828 Contact with and (suspected) exposure to other viral communicable diseases: Secondary | ICD-10-CM | POA: Diagnosis not present

## 2019-06-21 DIAGNOSIS — Z1159 Encounter for screening for other viral diseases: Secondary | ICD-10-CM | POA: Diagnosis not present

## 2019-06-28 DIAGNOSIS — Z20828 Contact with and (suspected) exposure to other viral communicable diseases: Secondary | ICD-10-CM | POA: Diagnosis not present

## 2019-06-28 DIAGNOSIS — Z1159 Encounter for screening for other viral diseases: Secondary | ICD-10-CM | POA: Diagnosis not present

## 2019-07-05 DIAGNOSIS — Z20828 Contact with and (suspected) exposure to other viral communicable diseases: Secondary | ICD-10-CM | POA: Diagnosis not present

## 2019-07-05 DIAGNOSIS — Z1159 Encounter for screening for other viral diseases: Secondary | ICD-10-CM | POA: Diagnosis not present

## 2019-07-12 DIAGNOSIS — Z20828 Contact with and (suspected) exposure to other viral communicable diseases: Secondary | ICD-10-CM | POA: Diagnosis not present

## 2019-07-12 DIAGNOSIS — Z1159 Encounter for screening for other viral diseases: Secondary | ICD-10-CM | POA: Diagnosis not present

## 2019-07-19 DIAGNOSIS — M810 Age-related osteoporosis without current pathological fracture: Secondary | ICD-10-CM | POA: Diagnosis not present

## 2019-07-19 DIAGNOSIS — N183 Chronic kidney disease, stage 3 unspecified: Secondary | ICD-10-CM | POA: Diagnosis not present

## 2019-07-19 DIAGNOSIS — I10 Essential (primary) hypertension: Secondary | ICD-10-CM | POA: Diagnosis not present

## 2019-07-19 DIAGNOSIS — E785 Hyperlipidemia, unspecified: Secondary | ICD-10-CM | POA: Diagnosis not present

## 2019-07-19 DIAGNOSIS — E1122 Type 2 diabetes mellitus with diabetic chronic kidney disease: Secondary | ICD-10-CM | POA: Diagnosis not present

## 2019-07-19 DIAGNOSIS — I6350 Cerebral infarction due to unspecified occlusion or stenosis of unspecified cerebral artery: Secondary | ICD-10-CM | POA: Diagnosis not present

## 2019-07-26 DIAGNOSIS — Z1159 Encounter for screening for other viral diseases: Secondary | ICD-10-CM | POA: Diagnosis not present

## 2019-07-26 DIAGNOSIS — Z20828 Contact with and (suspected) exposure to other viral communicable diseases: Secondary | ICD-10-CM | POA: Diagnosis not present

## 2019-08-02 DIAGNOSIS — Z1159 Encounter for screening for other viral diseases: Secondary | ICD-10-CM | POA: Diagnosis not present

## 2019-08-02 DIAGNOSIS — Z20828 Contact with and (suspected) exposure to other viral communicable diseases: Secondary | ICD-10-CM | POA: Diagnosis not present

## 2019-08-07 DIAGNOSIS — E1122 Type 2 diabetes mellitus with diabetic chronic kidney disease: Secondary | ICD-10-CM | POA: Diagnosis not present

## 2019-08-07 DIAGNOSIS — I6350 Cerebral infarction due to unspecified occlusion or stenosis of unspecified cerebral artery: Secondary | ICD-10-CM | POA: Diagnosis not present

## 2019-08-07 DIAGNOSIS — K219 Gastro-esophageal reflux disease without esophagitis: Secondary | ICD-10-CM | POA: Diagnosis not present

## 2019-08-07 DIAGNOSIS — N183 Chronic kidney disease, stage 3 unspecified: Secondary | ICD-10-CM | POA: Diagnosis not present

## 2019-08-07 DIAGNOSIS — I10 Essential (primary) hypertension: Secondary | ICD-10-CM | POA: Diagnosis not present

## 2019-08-09 DIAGNOSIS — E1122 Type 2 diabetes mellitus with diabetic chronic kidney disease: Secondary | ICD-10-CM | POA: Diagnosis not present

## 2019-08-09 DIAGNOSIS — M810 Age-related osteoporosis without current pathological fracture: Secondary | ICD-10-CM | POA: Diagnosis not present

## 2019-08-09 DIAGNOSIS — Z20828 Contact with and (suspected) exposure to other viral communicable diseases: Secondary | ICD-10-CM | POA: Diagnosis not present

## 2019-08-09 DIAGNOSIS — N183 Chronic kidney disease, stage 3 unspecified: Secondary | ICD-10-CM | POA: Diagnosis not present

## 2019-08-09 DIAGNOSIS — E785 Hyperlipidemia, unspecified: Secondary | ICD-10-CM | POA: Diagnosis not present

## 2019-08-09 DIAGNOSIS — I10 Essential (primary) hypertension: Secondary | ICD-10-CM | POA: Diagnosis not present

## 2019-08-09 DIAGNOSIS — Z1159 Encounter for screening for other viral diseases: Secondary | ICD-10-CM | POA: Diagnosis not present

## 2019-08-14 DIAGNOSIS — R400 Somnolence: Secondary | ICD-10-CM | POA: Diagnosis not present

## 2019-08-14 DIAGNOSIS — E785 Hyperlipidemia, unspecified: Secondary | ICD-10-CM | POA: Diagnosis not present

## 2019-08-14 DIAGNOSIS — R634 Abnormal weight loss: Secondary | ICD-10-CM | POA: Diagnosis not present

## 2019-08-14 DIAGNOSIS — Z1389 Encounter for screening for other disorder: Secondary | ICD-10-CM | POA: Diagnosis not present

## 2019-08-14 DIAGNOSIS — E1122 Type 2 diabetes mellitus with diabetic chronic kidney disease: Secondary | ICD-10-CM | POA: Diagnosis not present

## 2019-08-14 DIAGNOSIS — B9620 Unspecified Escherichia coli [E. coli] as the cause of diseases classified elsewhere: Secondary | ICD-10-CM | POA: Diagnosis not present

## 2019-08-14 DIAGNOSIS — Z0000 Encounter for general adult medical examination without abnormal findings: Secondary | ICD-10-CM | POA: Diagnosis not present

## 2019-08-14 DIAGNOSIS — Z8673 Personal history of transient ischemic attack (TIA), and cerebral infarction without residual deficits: Secondary | ICD-10-CM | POA: Diagnosis not present

## 2019-08-14 DIAGNOSIS — I10 Essential (primary) hypertension: Secondary | ICD-10-CM | POA: Diagnosis not present

## 2019-08-14 DIAGNOSIS — N390 Urinary tract infection, site not specified: Secondary | ICD-10-CM | POA: Diagnosis not present

## 2019-08-16 DIAGNOSIS — Z20828 Contact with and (suspected) exposure to other viral communicable diseases: Secondary | ICD-10-CM | POA: Diagnosis not present

## 2019-08-16 DIAGNOSIS — Z1159 Encounter for screening for other viral diseases: Secondary | ICD-10-CM | POA: Diagnosis not present

## 2019-08-23 DIAGNOSIS — Z1159 Encounter for screening for other viral diseases: Secondary | ICD-10-CM | POA: Diagnosis not present

## 2019-08-23 DIAGNOSIS — Z20828 Contact with and (suspected) exposure to other viral communicable diseases: Secondary | ICD-10-CM | POA: Diagnosis not present

## 2019-08-30 DIAGNOSIS — Z20828 Contact with and (suspected) exposure to other viral communicable diseases: Secondary | ICD-10-CM | POA: Diagnosis not present

## 2019-08-30 DIAGNOSIS — Z1159 Encounter for screening for other viral diseases: Secondary | ICD-10-CM | POA: Diagnosis not present

## 2019-09-06 DIAGNOSIS — Z20828 Contact with and (suspected) exposure to other viral communicable diseases: Secondary | ICD-10-CM | POA: Diagnosis not present

## 2019-09-06 DIAGNOSIS — Z1159 Encounter for screening for other viral diseases: Secondary | ICD-10-CM | POA: Diagnosis not present

## 2019-09-13 DIAGNOSIS — Z20828 Contact with and (suspected) exposure to other viral communicable diseases: Secondary | ICD-10-CM | POA: Diagnosis not present

## 2019-09-13 DIAGNOSIS — Z1159 Encounter for screening for other viral diseases: Secondary | ICD-10-CM | POA: Diagnosis not present

## 2019-09-20 DIAGNOSIS — Z1159 Encounter for screening for other viral diseases: Secondary | ICD-10-CM | POA: Diagnosis not present

## 2019-09-20 DIAGNOSIS — Z20828 Contact with and (suspected) exposure to other viral communicable diseases: Secondary | ICD-10-CM | POA: Diagnosis not present

## 2019-09-27 DIAGNOSIS — Z1159 Encounter for screening for other viral diseases: Secondary | ICD-10-CM | POA: Diagnosis not present

## 2019-09-27 DIAGNOSIS — Z20828 Contact with and (suspected) exposure to other viral communicable diseases: Secondary | ICD-10-CM | POA: Diagnosis not present

## 2019-10-04 DIAGNOSIS — Z20828 Contact with and (suspected) exposure to other viral communicable diseases: Secondary | ICD-10-CM | POA: Diagnosis not present

## 2019-10-04 DIAGNOSIS — Z1159 Encounter for screening for other viral diseases: Secondary | ICD-10-CM | POA: Diagnosis not present

## 2019-10-10 DIAGNOSIS — N183 Chronic kidney disease, stage 3 unspecified: Secondary | ICD-10-CM | POA: Diagnosis not present

## 2019-10-10 DIAGNOSIS — I10 Essential (primary) hypertension: Secondary | ICD-10-CM | POA: Diagnosis not present

## 2019-10-10 DIAGNOSIS — M810 Age-related osteoporosis without current pathological fracture: Secondary | ICD-10-CM | POA: Diagnosis not present

## 2019-10-10 DIAGNOSIS — E785 Hyperlipidemia, unspecified: Secondary | ICD-10-CM | POA: Diagnosis not present

## 2019-10-10 DIAGNOSIS — E1122 Type 2 diabetes mellitus with diabetic chronic kidney disease: Secondary | ICD-10-CM | POA: Diagnosis not present

## 2019-10-11 DIAGNOSIS — Z1159 Encounter for screening for other viral diseases: Secondary | ICD-10-CM | POA: Diagnosis not present

## 2019-10-11 DIAGNOSIS — Z20828 Contact with and (suspected) exposure to other viral communicable diseases: Secondary | ICD-10-CM | POA: Diagnosis not present

## 2019-10-18 DIAGNOSIS — Z1159 Encounter for screening for other viral diseases: Secondary | ICD-10-CM | POA: Diagnosis not present

## 2019-10-18 DIAGNOSIS — Z20828 Contact with and (suspected) exposure to other viral communicable diseases: Secondary | ICD-10-CM | POA: Diagnosis not present

## 2019-10-25 DIAGNOSIS — Z1159 Encounter for screening for other viral diseases: Secondary | ICD-10-CM | POA: Diagnosis not present

## 2019-10-25 DIAGNOSIS — Z20828 Contact with and (suspected) exposure to other viral communicable diseases: Secondary | ICD-10-CM | POA: Diagnosis not present

## 2019-11-01 DIAGNOSIS — Z1159 Encounter for screening for other viral diseases: Secondary | ICD-10-CM | POA: Diagnosis not present

## 2019-11-01 DIAGNOSIS — Z20828 Contact with and (suspected) exposure to other viral communicable diseases: Secondary | ICD-10-CM | POA: Diagnosis not present

## 2019-11-24 DEATH — deceased
